# Patient Record
Sex: Female | Born: 1945 | Race: White | Hispanic: No | Marital: Married | Smoking: Former smoker
Health system: Southern US, Community
[De-identification: ages and names within clinical notes are randomized; demographics above are authoritative.]

## PROBLEM LIST (undated history)

## (undated) DIAGNOSIS — C801 Malignant (primary) neoplasm, unspecified: Secondary | ICD-10-CM

## (undated) DIAGNOSIS — K589 Irritable bowel syndrome without diarrhea: Secondary | ICD-10-CM

## (undated) DIAGNOSIS — I10 Essential (primary) hypertension: Secondary | ICD-10-CM

## (undated) DIAGNOSIS — Z9221 Personal history of antineoplastic chemotherapy: Secondary | ICD-10-CM

## (undated) DIAGNOSIS — M1990 Unspecified osteoarthritis, unspecified site: Secondary | ICD-10-CM

## (undated) DIAGNOSIS — E785 Hyperlipidemia, unspecified: Secondary | ICD-10-CM

## (undated) HISTORY — DX: Irritable bowel syndrome, unspecified: K58.9

## (undated) HISTORY — DX: Unspecified osteoarthritis, unspecified site: M19.90

## (undated) HISTORY — DX: Irritable bowel syndrome without diarrhea: K58.9

## (undated) HISTORY — DX: Hyperlipidemia, unspecified: E78.5

## (undated) HISTORY — PX: BREAST SURGERY: SHX581

---

## 1998-03-22 ENCOUNTER — Other Ambulatory Visit: Admission: RE | Admit: 1998-03-22 | Discharge: 1998-03-22 | Payer: Self-pay | Admitting: Obstetrics & Gynecology

## 1999-09-12 ENCOUNTER — Encounter: Payer: Self-pay | Admitting: Obstetrics and Gynecology

## 1999-09-12 ENCOUNTER — Encounter: Admission: RE | Admit: 1999-09-12 | Discharge: 1999-09-12 | Payer: Self-pay | Admitting: Obstetrics and Gynecology

## 1999-10-16 ENCOUNTER — Other Ambulatory Visit: Admission: RE | Admit: 1999-10-16 | Discharge: 1999-10-16 | Payer: Self-pay | Admitting: Obstetrics and Gynecology

## 2000-09-25 ENCOUNTER — Encounter: Payer: Self-pay | Admitting: Family Medicine

## 2000-09-25 ENCOUNTER — Encounter: Admission: RE | Admit: 2000-09-25 | Discharge: 2000-09-25 | Payer: Self-pay | Admitting: Family Medicine

## 2001-10-01 ENCOUNTER — Encounter: Admission: RE | Admit: 2001-10-01 | Discharge: 2001-10-01 | Payer: Self-pay | Admitting: Family Medicine

## 2001-10-01 ENCOUNTER — Encounter: Payer: Self-pay | Admitting: Family Medicine

## 2001-10-01 ENCOUNTER — Other Ambulatory Visit: Admission: RE | Admit: 2001-10-01 | Discharge: 2001-10-01 | Payer: Self-pay | Admitting: Obstetrics and Gynecology

## 2002-04-01 ENCOUNTER — Ambulatory Visit (HOSPITAL_COMMUNITY): Admission: RE | Admit: 2002-04-01 | Discharge: 2002-04-01 | Payer: Self-pay | Admitting: Gastroenterology

## 2002-09-03 ENCOUNTER — Emergency Department (HOSPITAL_COMMUNITY): Admission: EM | Admit: 2002-09-03 | Discharge: 2002-09-03 | Payer: Self-pay

## 2003-01-18 ENCOUNTER — Other Ambulatory Visit: Admission: RE | Admit: 2003-01-18 | Discharge: 2003-01-18 | Payer: Self-pay | Admitting: Obstetrics and Gynecology

## 2004-02-19 ENCOUNTER — Other Ambulatory Visit: Admission: RE | Admit: 2004-02-19 | Discharge: 2004-02-19 | Payer: Self-pay | Admitting: Obstetrics and Gynecology

## 2004-02-20 ENCOUNTER — Encounter: Admission: RE | Admit: 2004-02-20 | Discharge: 2004-02-20 | Payer: Self-pay | Admitting: Obstetrics and Gynecology

## 2005-02-20 ENCOUNTER — Encounter: Admission: RE | Admit: 2005-02-20 | Discharge: 2005-02-20 | Payer: Self-pay | Admitting: Obstetrics and Gynecology

## 2005-03-20 ENCOUNTER — Other Ambulatory Visit: Admission: RE | Admit: 2005-03-20 | Discharge: 2005-03-20 | Payer: Self-pay | Admitting: Obstetrics and Gynecology

## 2009-05-12 DIAGNOSIS — Z9221 Personal history of antineoplastic chemotherapy: Secondary | ICD-10-CM

## 2009-05-12 HISTORY — PX: BREAST BIOPSY: SHX20

## 2009-05-12 HISTORY — DX: Personal history of antineoplastic chemotherapy: Z92.21

## 2009-09-11 ENCOUNTER — Encounter: Admission: RE | Admit: 2009-09-11 | Discharge: 2009-09-11 | Payer: Self-pay | Admitting: Obstetrics and Gynecology

## 2009-09-14 ENCOUNTER — Encounter: Admission: RE | Admit: 2009-09-14 | Discharge: 2009-09-14 | Payer: Self-pay | Admitting: Obstetrics and Gynecology

## 2009-09-20 ENCOUNTER — Ambulatory Visit: Payer: Self-pay | Admitting: Oncology

## 2009-09-20 ENCOUNTER — Encounter: Admission: RE | Admit: 2009-09-20 | Discharge: 2009-09-20 | Payer: Self-pay | Admitting: Obstetrics and Gynecology

## 2009-09-21 ENCOUNTER — Encounter: Admission: RE | Admit: 2009-09-21 | Discharge: 2009-09-21 | Payer: Self-pay | Admitting: Obstetrics and Gynecology

## 2009-09-25 ENCOUNTER — Encounter: Admission: RE | Admit: 2009-09-25 | Discharge: 2009-09-25 | Payer: Self-pay | Admitting: Obstetrics and Gynecology

## 2009-09-28 ENCOUNTER — Ambulatory Visit: Admission: RE | Admit: 2009-09-28 | Discharge: 2009-10-01 | Payer: Self-pay | Admitting: Radiation Oncology

## 2009-10-04 LAB — CBC WITH DIFFERENTIAL/PLATELET
Basophils Absolute: 0.1 10*3/uL (ref 0.0–0.1)
EOS%: 1 % (ref 0.0–7.0)
Eosinophils Absolute: 0.1 10*3/uL (ref 0.0–0.5)
HGB: 14.7 g/dL (ref 11.6–15.9)
MONO#: 0.6 10*3/uL (ref 0.1–0.9)
NEUT#: 4.7 10*3/uL (ref 1.5–6.5)
RDW: 13.4 % (ref 11.2–14.5)
WBC: 8.2 10*3/uL (ref 3.9–10.3)
lymph#: 2.8 10*3/uL (ref 0.9–3.3)

## 2009-10-04 LAB — COMPREHENSIVE METABOLIC PANEL
AST: 24 U/L (ref 0–37)
Albumin: 4.2 g/dL (ref 3.5–5.2)
BUN: 9 mg/dL (ref 6–23)
Calcium: 8.9 mg/dL (ref 8.4–10.5)
Chloride: 104 mEq/L (ref 96–112)
Glucose, Bld: 95 mg/dL (ref 70–99)
Potassium: 3.6 mEq/L (ref 3.5–5.3)

## 2009-10-16 ENCOUNTER — Ambulatory Visit (HOSPITAL_COMMUNITY): Admission: RE | Admit: 2009-10-16 | Discharge: 2009-10-16 | Payer: Self-pay | Admitting: Oncology

## 2009-10-16 ENCOUNTER — Encounter: Payer: Self-pay | Admitting: Oncology

## 2009-10-17 ENCOUNTER — Ambulatory Visit (HOSPITAL_COMMUNITY): Admission: RE | Admit: 2009-10-17 | Discharge: 2009-10-17 | Payer: Self-pay | Admitting: Oncology

## 2009-10-22 ENCOUNTER — Telehealth (INDEPENDENT_AMBULATORY_CARE_PROVIDER_SITE_OTHER): Payer: Self-pay | Admitting: *Deleted

## 2009-10-23 ENCOUNTER — Encounter (HOSPITAL_COMMUNITY): Admission: RE | Admit: 2009-10-23 | Discharge: 2009-12-11 | Payer: Self-pay | Admitting: Oncology

## 2009-10-23 ENCOUNTER — Ambulatory Visit: Payer: Self-pay

## 2009-10-23 ENCOUNTER — Ambulatory Visit: Payer: Self-pay | Admitting: Cardiovascular Disease

## 2009-10-24 ENCOUNTER — Ambulatory Visit (HOSPITAL_BASED_OUTPATIENT_CLINIC_OR_DEPARTMENT_OTHER): Admission: RE | Admit: 2009-10-24 | Discharge: 2009-10-24 | Payer: Self-pay | Admitting: General Surgery

## 2009-10-26 ENCOUNTER — Ambulatory Visit: Payer: Self-pay | Admitting: Oncology

## 2009-10-30 LAB — CBC WITH DIFFERENTIAL/PLATELET
BASO%: 0.1 % (ref 0.0–2.0)
LYMPH%: 8.8 % — ABNORMAL LOW (ref 14.0–49.7)
MCHC: 33.9 g/dL (ref 31.5–36.0)
MONO#: 0.8 10*3/uL (ref 0.1–0.9)
MONO%: 5.8 % (ref 0.0–14.0)
Platelets: 327 10*3/uL (ref 145–400)
RBC: 4.83 10*6/uL (ref 3.70–5.45)
RDW: 13.7 % (ref 11.2–14.5)
WBC: 13.6 10*3/uL — ABNORMAL HIGH (ref 3.9–10.3)
nRBC: 0 % (ref 0–0)

## 2009-11-06 LAB — CBC WITH DIFFERENTIAL/PLATELET
BASO%: 0.8 % (ref 0.0–2.0)
EOS%: 0.2 % (ref 0.0–7.0)
MCH: 30.9 pg (ref 25.1–34.0)
MCHC: 34.4 g/dL (ref 31.5–36.0)
RDW: 13 % (ref 11.2–14.5)
WBC: 15.2 10*3/uL — ABNORMAL HIGH (ref 3.9–10.3)
lymph#: 2.5 10*3/uL (ref 0.9–3.3)

## 2009-11-13 LAB — CBC WITH DIFFERENTIAL/PLATELET
Basophils Absolute: 0.1 10*3/uL (ref 0.0–0.1)
EOS%: 0.1 % (ref 0.0–7.0)
Eosinophils Absolute: 0 10*3/uL (ref 0.0–0.5)
HGB: 13.1 g/dL (ref 11.6–15.9)
MCH: 30.9 pg (ref 25.1–34.0)
NEUT#: 3.7 10*3/uL (ref 1.5–6.5)
RDW: 13.4 % (ref 11.2–14.5)
lymph#: 1.8 10*3/uL (ref 0.9–3.3)

## 2009-11-20 LAB — CBC WITH DIFFERENTIAL/PLATELET
Basophils Absolute: 0 10*3/uL (ref 0.0–0.1)
Eosinophils Absolute: 0 10*3/uL (ref 0.0–0.5)
HGB: 13.6 g/dL (ref 11.6–15.9)
MCV: 91 fL (ref 79.5–101.0)
MONO%: 0.5 % (ref 0.0–14.0)
NEUT#: 10.7 10*3/uL — ABNORMAL HIGH (ref 1.5–6.5)
RDW: 14 % (ref 11.2–14.5)

## 2009-11-27 ENCOUNTER — Ambulatory Visit: Payer: Self-pay | Admitting: Oncology

## 2009-11-27 LAB — CBC WITH DIFFERENTIAL/PLATELET
Basophils Absolute: 0 10*3/uL (ref 0.0–0.1)
Eosinophils Absolute: 0.1 10*3/uL (ref 0.0–0.5)
HCT: 41.9 % (ref 34.8–46.6)
HGB: 14.4 g/dL (ref 11.6–15.9)
LYMPH%: 16.4 % (ref 14.0–49.7)
MCV: 89.1 fL (ref 79.5–101.0)
MONO%: 11.7 % (ref 0.0–14.0)
NEUT#: 8 10*3/uL — ABNORMAL HIGH (ref 1.5–6.5)
NEUT%: 71.2 % (ref 38.4–76.8)
Platelets: 146 10*3/uL (ref 145–400)
RBC: 4.7 10*6/uL (ref 3.70–5.45)

## 2009-11-28 LAB — URINALYSIS, MICROSCOPIC - CHCC
Nitrite: NEGATIVE
pH: 6 (ref 4.6–8.0)

## 2009-11-30 LAB — URINE CULTURE

## 2009-12-11 LAB — CBC WITH DIFFERENTIAL/PLATELET
Basophils Absolute: 0 10*3/uL (ref 0.0–0.1)
EOS%: 0 % (ref 0.0–7.0)
HGB: 12.7 g/dL (ref 11.6–15.9)
LYMPH%: 10.1 % — ABNORMAL LOW (ref 14.0–49.7)
MCH: 31.1 pg (ref 25.1–34.0)
MCV: 91.2 fL (ref 79.5–101.0)
MONO%: 5.3 % (ref 0.0–14.0)
NEUT%: 84.6 % — ABNORMAL HIGH (ref 38.4–76.8)
RDW: 15.8 % — ABNORMAL HIGH (ref 11.2–14.5)

## 2009-12-18 LAB — CBC WITH DIFFERENTIAL/PLATELET
BASO%: 0.6 % (ref 0.0–2.0)
LYMPH%: 15.6 % (ref 14.0–49.7)
MCH: 31.6 pg (ref 25.1–34.0)
MCHC: 34.3 g/dL (ref 31.5–36.0)
MCV: 92.2 fL (ref 79.5–101.0)
MONO%: 9.4 % (ref 0.0–14.0)
Platelets: 185 10*3/uL (ref 145–400)
RBC: 4.38 10*6/uL (ref 3.70–5.45)

## 2009-12-27 ENCOUNTER — Encounter: Admission: RE | Admit: 2009-12-27 | Discharge: 2009-12-27 | Payer: Self-pay | Admitting: Oncology

## 2009-12-28 ENCOUNTER — Ambulatory Visit: Payer: Self-pay | Admitting: Oncology

## 2009-12-28 LAB — COMPREHENSIVE METABOLIC PANEL
Albumin: 4.2 g/dL (ref 3.5–5.2)
BUN: 8 mg/dL (ref 6–23)
CO2: 26 mEq/L (ref 19–32)
Calcium: 8.9 mg/dL (ref 8.4–10.5)
Chloride: 104 mEq/L (ref 96–112)
Glucose, Bld: 85 mg/dL (ref 70–99)
Potassium: 3.2 mEq/L — ABNORMAL LOW (ref 3.5–5.3)

## 2009-12-28 LAB — URINALYSIS, MICROSCOPIC - CHCC
Bilirubin (Urine): NEGATIVE
Leukocyte Esterase: NEGATIVE
Nitrite: NEGATIVE
Protein: 30 mg/dL
pH: 6 (ref 4.6–8.0)

## 2009-12-28 LAB — CBC WITH DIFFERENTIAL/PLATELET
BASO%: 0.3 % (ref 0.0–2.0)
EOS%: 0 % (ref 0.0–7.0)
LYMPH%: 29.3 % (ref 14.0–49.7)
MCH: 31.5 pg (ref 25.1–34.0)
MCHC: 33.7 g/dL (ref 31.5–36.0)
MONO#: 0.5 10*3/uL (ref 0.1–0.9)
RBC: 3.9 10*6/uL (ref 3.70–5.45)
WBC: 3.9 10*3/uL (ref 3.9–10.3)
lymph#: 1.1 10*3/uL (ref 0.9–3.3)

## 2009-12-30 LAB — URINE CULTURE

## 2010-01-01 LAB — COMPREHENSIVE METABOLIC PANEL
AST: 25 U/L (ref 0–37)
Albumin: 4.5 g/dL (ref 3.5–5.2)
BUN: 18 mg/dL (ref 6–23)
Calcium: 9.5 mg/dL (ref 8.4–10.5)
Chloride: 104 mEq/L (ref 96–112)
Glucose, Bld: 144 mg/dL — ABNORMAL HIGH (ref 70–99)
Potassium: 3.8 mEq/L (ref 3.5–5.3)
Sodium: 137 mEq/L (ref 135–145)
Total Protein: 7.2 g/dL (ref 6.0–8.3)

## 2010-01-01 LAB — CBC WITH DIFFERENTIAL/PLATELET
Basophils Absolute: 0 10*3/uL (ref 0.0–0.1)
EOS%: 0 % (ref 0.0–7.0)
Eosinophils Absolute: 0 10*3/uL (ref 0.0–0.5)
HGB: 12.4 g/dL (ref 11.6–15.9)
NEUT#: 6.6 10*3/uL — ABNORMAL HIGH (ref 1.5–6.5)
RBC: 3.88 10*6/uL (ref 3.70–5.45)
RDW: 17.9 % — ABNORMAL HIGH (ref 11.2–14.5)
lymph#: 0.8 10*3/uL — ABNORMAL LOW (ref 0.9–3.3)

## 2010-01-04 LAB — URINALYSIS, MICROSCOPIC - CHCC
Bilirubin (Urine): NEGATIVE
Nitrite: NEGATIVE
Protein: NEGATIVE mg/dL
pH: 6 (ref 4.6–8.0)

## 2010-01-08 LAB — CBC WITH DIFFERENTIAL/PLATELET
BASO%: 2.6 % — ABNORMAL HIGH (ref 0.0–2.0)
EOS%: 0.1 % (ref 0.0–7.0)
MCH: 32.4 pg (ref 25.1–34.0)
MCHC: 33.7 g/dL (ref 31.5–36.0)
RBC: 3.85 10*6/uL (ref 3.70–5.45)
RDW: 19.4 % — ABNORMAL HIGH (ref 11.2–14.5)
lymph#: 1.7 10*3/uL (ref 0.9–3.3)

## 2010-01-22 LAB — CBC WITH DIFFERENTIAL/PLATELET
Basophils Absolute: 0 10*3/uL (ref 0.0–0.1)
EOS%: 0.1 % (ref 0.0–7.0)
HCT: 33.8 % — ABNORMAL LOW (ref 34.8–46.6)
HGB: 11.4 g/dL — ABNORMAL LOW (ref 11.6–15.9)
LYMPH%: 5.7 % — ABNORMAL LOW (ref 14.0–49.7)
MCH: 33.5 pg (ref 25.1–34.0)
MCV: 99.8 fL (ref 79.5–101.0)
MONO%: 2.2 % (ref 0.0–14.0)
NEUT%: 91.8 % — ABNORMAL HIGH (ref 38.4–76.8)

## 2010-01-22 LAB — COMPREHENSIVE METABOLIC PANEL
AST: 28 U/L (ref 0–37)
Alkaline Phosphatase: 63 U/L (ref 39–117)
BUN: 17 mg/dL (ref 6–23)
Calcium: 9.1 mg/dL (ref 8.4–10.5)
Creatinine, Ser: 0.93 mg/dL (ref 0.40–1.20)
Total Bilirubin: 0.8 mg/dL (ref 0.3–1.2)

## 2010-01-26 ENCOUNTER — Emergency Department (HOSPITAL_COMMUNITY): Admission: EM | Admit: 2010-01-26 | Discharge: 2010-01-26 | Payer: Self-pay | Admitting: Emergency Medicine

## 2010-01-29 ENCOUNTER — Ambulatory Visit: Payer: Self-pay | Admitting: Oncology

## 2010-01-29 ENCOUNTER — Encounter: Payer: Self-pay | Admitting: Cardiovascular Disease

## 2010-01-29 LAB — CBC WITH DIFFERENTIAL/PLATELET
Basophils Absolute: 0.2 10*3/uL — ABNORMAL HIGH (ref 0.0–0.1)
EOS%: 0.1 % (ref 0.0–7.0)
HCT: 35.4 % (ref 34.8–46.6)
HGB: 12.2 g/dL (ref 11.6–15.9)
MCH: 34.1 pg — ABNORMAL HIGH (ref 25.1–34.0)
MCV: 99.3 fL (ref 79.5–101.0)
NEUT%: 76.5 % (ref 38.4–76.8)
lymph#: 1.7 10*3/uL (ref 0.9–3.3)

## 2010-02-02 ENCOUNTER — Emergency Department (HOSPITAL_COMMUNITY): Admission: EM | Admit: 2010-02-02 | Discharge: 2010-02-02 | Payer: Self-pay | Admitting: Emergency Medicine

## 2010-02-06 ENCOUNTER — Telehealth (INDEPENDENT_AMBULATORY_CARE_PROVIDER_SITE_OTHER): Payer: Self-pay | Admitting: *Deleted

## 2010-02-07 ENCOUNTER — Encounter (HOSPITAL_COMMUNITY): Admission: RE | Admit: 2010-02-07 | Discharge: 2010-02-15 | Payer: Self-pay | Admitting: Oncology

## 2010-02-07 ENCOUNTER — Encounter: Payer: Self-pay | Admitting: Internal Medicine

## 2010-02-07 ENCOUNTER — Ambulatory Visit: Payer: Self-pay

## 2010-02-07 ENCOUNTER — Ambulatory Visit: Payer: Self-pay | Admitting: Cardiology

## 2010-02-09 ENCOUNTER — Ambulatory Visit: Payer: Self-pay | Admitting: Oncology

## 2010-02-12 LAB — URINALYSIS, MICROSCOPIC - CHCC
Bilirubin (Urine): NEGATIVE
Ketones: NEGATIVE mg/dL
Specific Gravity, Urine: 1.025 (ref 1.003–1.035)

## 2010-02-12 LAB — CBC WITH DIFFERENTIAL/PLATELET
Basophils Absolute: 0 10*3/uL (ref 0.0–0.1)
EOS%: 0 % (ref 0.0–7.0)
Eosinophils Absolute: 0 10*3/uL (ref 0.0–0.5)
HCT: 31.6 % — ABNORMAL LOW (ref 34.8–46.6)
HGB: 10.8 g/dL — ABNORMAL LOW (ref 11.6–15.9)
MCH: 35.1 pg — ABNORMAL HIGH (ref 25.1–34.0)
MCV: 102.6 fL — ABNORMAL HIGH (ref 79.5–101.0)
MONO%: 5.1 % (ref 0.0–14.0)
NEUT#: 6.2 10*3/uL (ref 1.5–6.5)
NEUT%: 84.6 % — ABNORMAL HIGH (ref 38.4–76.8)
RDW: 17.7 % — ABNORMAL HIGH (ref 11.2–14.5)
lymph#: 0.8 10*3/uL — ABNORMAL LOW (ref 0.9–3.3)

## 2010-02-12 LAB — COMPREHENSIVE METABOLIC PANEL
AST: 34 U/L (ref 0–37)
Albumin: 4.1 g/dL (ref 3.5–5.2)
BUN: 17 mg/dL (ref 6–23)
Calcium: 9.1 mg/dL (ref 8.4–10.5)
Chloride: 99 mEq/L (ref 96–112)
Creatinine, Ser: 0.98 mg/dL (ref 0.40–1.20)
Glucose, Bld: 165 mg/dL — ABNORMAL HIGH (ref 70–99)
Potassium: 3.1 mEq/L — ABNORMAL LOW (ref 3.5–5.3)

## 2010-02-14 LAB — URINE CULTURE

## 2010-02-17 ENCOUNTER — Encounter: Admission: RE | Admit: 2010-02-17 | Discharge: 2010-02-17 | Payer: Self-pay | Admitting: Oncology

## 2010-02-25 LAB — CBC WITH DIFFERENTIAL/PLATELET
BASO%: 0.3 % (ref 0.0–2.0)
Basophils Absolute: 0.1 10*3/uL (ref 0.0–0.1)
EOS%: 0.2 % (ref 0.0–7.0)
MCH: 34.7 pg — ABNORMAL HIGH (ref 25.1–34.0)
MCHC: 33.2 g/dL (ref 31.5–36.0)
MCV: 104.5 fL — ABNORMAL HIGH (ref 79.5–101.0)
MONO%: 8 % (ref 0.0–14.0)
RBC: 3.54 10*6/uL — ABNORMAL LOW (ref 3.70–5.45)
RDW: 14.9 % — ABNORMAL HIGH (ref 11.2–14.5)
lymph#: 2.2 10*3/uL (ref 0.9–3.3)
nRBC: 0 % (ref 0–0)

## 2010-03-01 ENCOUNTER — Telehealth (INDEPENDENT_AMBULATORY_CARE_PROVIDER_SITE_OTHER): Payer: Self-pay | Admitting: *Deleted

## 2010-03-05 ENCOUNTER — Encounter (INDEPENDENT_AMBULATORY_CARE_PROVIDER_SITE_OTHER): Payer: Self-pay | Admitting: General Surgery

## 2010-03-05 ENCOUNTER — Ambulatory Visit (HOSPITAL_COMMUNITY): Admission: RE | Admit: 2010-03-05 | Discharge: 2010-03-06 | Payer: Self-pay | Admitting: General Surgery

## 2010-03-05 HISTORY — PX: MASTECTOMY: SHX3

## 2010-03-21 ENCOUNTER — Ambulatory Visit: Payer: Self-pay | Admitting: Oncology

## 2010-03-25 ENCOUNTER — Telehealth (INDEPENDENT_AMBULATORY_CARE_PROVIDER_SITE_OTHER): Payer: Self-pay | Admitting: *Deleted

## 2010-03-25 LAB — COMPREHENSIVE METABOLIC PANEL
Alkaline Phosphatase: 61 U/L (ref 39–117)
CO2: 29 mEq/L (ref 19–32)
Creatinine, Ser: 0.75 mg/dL (ref 0.40–1.20)
Glucose, Bld: 98 mg/dL (ref 70–99)
Total Bilirubin: 0.5 mg/dL (ref 0.3–1.2)

## 2010-03-25 LAB — CBC WITH DIFFERENTIAL/PLATELET
BASO%: 0.7 % (ref 0.0–2.0)
Basophils Absolute: 0.1 10*3/uL (ref 0.0–0.1)
EOS%: 0.4 % (ref 0.0–7.0)
HGB: 12.5 g/dL (ref 11.6–15.9)
MCH: 34.7 pg — ABNORMAL HIGH (ref 25.1–34.0)
RDW: 14.4 % (ref 11.2–14.5)
lymph#: 1.7 10*3/uL (ref 0.9–3.3)

## 2010-04-16 ENCOUNTER — Ambulatory Visit
Admission: RE | Admit: 2010-04-16 | Discharge: 2010-06-11 | Payer: Self-pay | Source: Home / Self Care | Attending: Radiation Oncology | Admitting: Radiation Oncology

## 2010-06-02 ENCOUNTER — Encounter: Payer: Self-pay | Admitting: Obstetrics and Gynecology

## 2010-06-11 NOTE — Progress Notes (Signed)
Summary: Nuclear MUGA Pre-Procedure  Phone Note Outgoing Call Call back at [facility] Phone [phone]   Call placed by: Stanton Kidney, EMT-P,  October 22, 2009 3:31 PM Summary of Call: Unable to leave message with information on Myoview Information Sheet (see scanned document for details). No answer/no machine.     Nuclear Med Background Indications for Stress Test: Evaluation for Ischemia  Indications Comments: Prior Chemotherapy Evaluation  History: Echo  History Comments: 10/16/09 Echo: EF= 35-40%     Nuclear Pre-Procedure Cardiac Risk Factors: Hypertension, Lipids

## 2010-06-11 NOTE — Letter (Signed)
Summary: [facility] Physician Order [facility] Physician Order Form   Imported By: Roderic Ovens 02/13/2010 12:17:08  _____________________________________________________________________  External Attachment:    Type:   Image     Comment:   External Document

## 2010-06-11 NOTE — Progress Notes (Signed)
Summary: Nuclear pre procedure  Phone Note Outgoing Call Call back at [facility] Phone [phone]   Call placed by: Rea College, CMA,  February 06, 2010 5:17 PM Call placed to: Patient Summary of Call: Reviewed information on Muga Information Sheet (see scanned document for further details).  Spoke with patient.

## 2010-06-11 NOTE — Progress Notes (Signed)
  Phone Note From Other Clinic   Caller: Tonya/[facility] Initial call taken by: Km    Faxed Muga over to [phone] [facility]  March 01, 2010 12:05 PM

## 2010-06-11 NOTE — Progress Notes (Signed)
Summary: Records Request  Faxed MUGA to [place] at [phone]. Debby Freiberg  March 01, 2010 10:23 AM

## 2010-06-11 NOTE — Progress Notes (Signed)
Summary: Records Request  Faxed MUGA to [place] at [facility] ([phone]). Debby Freiberg  March 25, 2010 1:58 PM

## 2010-06-11 NOTE — Assessment & Plan Note (Signed)
Summary: Cardiology Nuclear Testing    Muga Study  Indication: Evaluate LVEF on patient receiving chemotherpy for breast cancer. Previous MUGA showed an EF of 35-40%  Muga Information: The patient's red blood cells were labeled using the Ultra Tag method with 33  mci of Technitium-67m Pertechnetate.  The images were reconstructed in the Anterior, Lateral and Left Anterior Oblique Views.  Impression: The patient's gated ejection fraction was 59.9 % and the wall motion was normal.    Nuclear Med Background Indications for Stress Test: Evaluation for Ischemia  Indications Comments: Prior Chemotherapy Evaluation  History: Echo  History Comments: 10/16/09 Echo: EF= 35-40%     Nuclear Pre-Procedure Cardiac Risk Factors: Hypertension, Lipids

## 2010-06-11 NOTE — Assessment & Plan Note (Signed)
Summary: MUGA Study    Muga Study  Indication: Evaluation for ischemia Evaluation prior to chemotherapy, d/t recent Echo EF= 35-40% 20g angiocath established (L) AC by: Stanton Kidney, EMT-P  October 23, 2009 2:26 PM   Muga Information: The patient's red blood cells were labeled using the Ultra Tag method with 32.0 mci of Technitium-13m Pertechnetate.  The images were reconstructed in the Anterior, Lateral and Left Anterior Oblique Views.  Impression: Images were reviewed in the best lateral, AP and LAO projections.  The lateral images were suboptimal but overall LV cavity size and funtion were normal. The calculated EF was 63%

## 2010-06-12 ENCOUNTER — Ambulatory Visit: Payer: Self-pay | Admitting: Radiation Oncology

## 2010-06-12 ENCOUNTER — Ambulatory Visit (HOSPITAL_BASED_OUTPATIENT_CLINIC_OR_DEPARTMENT_OTHER)
Admission: RE | Admit: 2010-06-12 | Discharge: 2010-06-12 | Disposition: A | Discharge status: Home / Self Care | Payer: MEDICARE | Attending: General Surgery | Admitting: General Surgery

## 2010-06-12 DIAGNOSIS — Z452 Encounter for adjustment and management of vascular access device: Secondary | ICD-10-CM | POA: Insufficient documentation

## 2010-06-12 DIAGNOSIS — C50919 Malignant neoplasm of unspecified site of unspecified female breast: Secondary | ICD-10-CM | POA: Insufficient documentation

## 2010-06-14 NOTE — Op Note (Signed)
  NAMEJOANI, Savannah Jones           ACCOUNT NO.:  [id number]  MEDICAL RECORD NO.:  [id number]          PATIENT TYPE:  AMB  LOCATION:  DSC                          FACILITY:  [facility]  PHYSICIAN:  Ollen Gross. Vernell Morgans, M.D. DATE OF BIRTH:  04/05/1946  DATE OF PROCEDURE:  06/12/2010 DATE OF DISCHARGE:                              OPERATIVE REPORT   PREOPERATIVE DIAGNOSIS:  History of right breast cancer.  POSTOPERATIVE DIAGNOSIS:  History of right breast cancer.  PROCEDURE:  Removal of left subclavian vein Port-A-Cath.  SURGEON:  Ollen Gross. Vernell Morgans, MD  ANESTHESIA:  Local.  PROCEDURE:  After informed consent was obtained, the patient was brought to the operating room, placed in supine position on the operating table. The patient's left chest area was prepped with DuraPrep, allowed to dry and then draped in usual sterile manner.  The area around the port was infiltrated with 1% lidocaine with epinephrine.  A small incision was made with 15 blade knife through the old incision.  This incision was carried down through the subcutaneous tissue sharply with Metzenbaum scissors until the port was identified.  The capsule the port was opened with a 15 blade knife.  The two anchoring stitches were identified and were grasped with a hemostat, divided, and removed.  The port was then able to be easily pushed out of its capsule.  With gentle traction, the port was removed without difficulty.  Pressure was held for several minutes until the area was completely hemostatic.  The deep layer of the wound was then closed with interrupted 3-0 Vicryl stitches, and the skin was closed with a running for Monocryl subcuticular stitch.  Dermabond dressing was applied.  The patient tolerated the procedure well.  At the end the of case, all needle, sponge, and instrument counts were correct. The patient was then taken to recovery room in stable condition.     Ollen Gross. Vernell Morgans, M.D.     PST/MEDQ  D:   06/12/2010  T:  06/12/2010  Job:  [id number]  Electronically Signed by Chevis Pretty III M.D. on 06/14/2010 08:55:18 AM

## 2010-06-25 ENCOUNTER — Other Ambulatory Visit: Payer: Self-pay | Admitting: Oncology

## 2010-06-25 ENCOUNTER — Encounter (HOSPITAL_BASED_OUTPATIENT_CLINIC_OR_DEPARTMENT_OTHER): Payer: MEDICARE | Admitting: Oncology

## 2010-06-25 DIAGNOSIS — C50419 Malignant neoplasm of upper-outer quadrant of unspecified female breast: Secondary | ICD-10-CM

## 2010-06-25 LAB — COMPREHENSIVE METABOLIC PANEL
ALT: 18 U/L (ref 0–35)
Albumin: 4.4 g/dL (ref 3.5–5.2)
Alkaline Phosphatase: 89 U/L (ref 39–117)
CO2: 27 mEq/L (ref 19–32)
Potassium: 3.5 mEq/L (ref 3.5–5.3)
Sodium: 140 mEq/L (ref 135–145)
Total Bilirubin: 0.3 mg/dL (ref 0.3–1.2)
Total Protein: 7.1 g/dL (ref 6.0–8.3)

## 2010-06-25 LAB — CBC WITH DIFFERENTIAL/PLATELET
BASO%: 0.5 % (ref 0.0–2.0)
LYMPH%: 28.6 % (ref 14.0–49.7)
MCHC: 32.6 g/dL (ref 31.5–36.0)
MONO#: 0.6 10*3/uL (ref 0.1–0.9)
MONO%: 7.2 % (ref 0.0–14.0)
NEUT#: 5.2 10*3/uL (ref 1.5–6.5)
Platelets: 301 10*3/uL (ref 145–400)
RBC: 4.57 10*6/uL (ref 3.70–5.45)
RDW: 13.3 % (ref 11.2–14.5)
WBC: 8.2 10*3/uL (ref 3.9–10.3)

## 2010-07-02 ENCOUNTER — Encounter (HOSPITAL_BASED_OUTPATIENT_CLINIC_OR_DEPARTMENT_OTHER): Payer: MEDICARE | Admitting: Oncology

## 2010-07-02 ENCOUNTER — Other Ambulatory Visit: Payer: Self-pay | Admitting: Oncology

## 2010-07-02 DIAGNOSIS — C50419 Malignant neoplasm of upper-outer quadrant of unspecified female breast: Secondary | ICD-10-CM

## 2010-07-02 DIAGNOSIS — Z1231 Encounter for screening mammogram for malignant neoplasm of breast: Secondary | ICD-10-CM

## 2010-07-24 LAB — CBC
HCT: 38.4 % (ref 36.0–46.0)
Hemoglobin: 12.7 g/dL (ref 12.0–15.0)
MCH: 34.5 pg — ABNORMAL HIGH (ref 26.0–34.0)
MCV: 104.3 fL — ABNORMAL HIGH (ref 78.0–100.0)
Platelets: 298 10*3/uL (ref 150–400)
RBC: 3.68 MIL/uL — ABNORMAL LOW (ref 3.87–5.11)
WBC: 10.9 10*3/uL — ABNORMAL HIGH (ref 4.0–10.5)

## 2010-07-24 LAB — COMPREHENSIVE METABOLIC PANEL
Albumin: 4.3 g/dL (ref 3.5–5.2)
Alkaline Phosphatase: 73 U/L (ref 39–117)
BUN: 9 mg/dL (ref 6–23)
CO2: 26 mEq/L (ref 19–32)
Chloride: 99 mEq/L (ref 96–112)
GFR calc non Af Amer: 59 mL/min — ABNORMAL LOW (ref >60)
Glucose, Bld: 100 mg/dL — ABNORMAL HIGH (ref 70–99)
Potassium: 3.9 mEq/L (ref 3.5–5.1)
Total Bilirubin: 0.6 mg/dL (ref 0.3–1.2)

## 2010-07-24 LAB — DIFFERENTIAL
Basophils Absolute: 0.1 10*3/uL (ref 0.0–0.1)
Basophils Relative: 1 % (ref 0–1)
Lymphocytes Relative: 22 % (ref 12–46)
Monocytes Relative: 9 % (ref 3–12)
Neutro Abs: 7.4 10*3/uL (ref 1.7–7.7)
Neutrophils Relative %: 68 % (ref 43–77)

## 2010-07-24 LAB — SURGICAL PCR SCREEN: MRSA, PCR: NEGATIVE

## 2010-07-25 LAB — URINALYSIS, ROUTINE W REFLEX MICROSCOPIC
Hgb urine dipstick: NEGATIVE
Ketones, ur: 15 mg/dL — AB
Nitrite: NEGATIVE
Nitrite: NEGATIVE
Protein, ur: 30 mg/dL — AB
Specific Gravity, Urine: 1.028 (ref 1.005–1.030)
Urobilinogen, UA: 1 mg/dL (ref 0.0–1.0)
pH: 6 (ref 5.0–8.0)

## 2010-07-25 LAB — DIFFERENTIAL
Basophils Absolute: 0 10*3/uL (ref 0.0–0.1)
Basophils Relative: 0 % (ref 0–1)
Lymphocytes Relative: 12 % (ref 12–46)
Lymphs Abs: 1.7 10*3/uL (ref 0.7–4.0)
Monocytes Relative: 0 % — ABNORMAL LOW (ref 3–12)
Monocytes Relative: 8 % (ref 3–12)
Neutro Abs: 11.4 10*3/uL — ABNORMAL HIGH (ref 1.7–7.7)
Neutro Abs: 6.9 10*3/uL (ref 1.7–7.7)
Neutrophils Relative %: 73 % (ref 43–77)

## 2010-07-25 LAB — URINE CULTURE
Colony Count: 30000
Colony Count: NO GROWTH
Culture: NO GROWTH

## 2010-07-25 LAB — COMPREHENSIVE METABOLIC PANEL
Albumin: 4.5 g/dL (ref 3.5–5.2)
BUN: 11 mg/dL (ref 6–23)
Chloride: 91 mEq/L — ABNORMAL LOW (ref 96–112)
Creatinine, Ser: 0.85 mg/dL (ref 0.4–1.2)
Total Bilirubin: 1.5 mg/dL — ABNORMAL HIGH (ref 0.3–1.2)
Total Protein: 7.1 g/dL (ref 6.0–8.3)

## 2010-07-25 LAB — LACTIC ACID, PLASMA
Lactic Acid, Venous: 2.2 mmol/L (ref 0.5–2.2)
Lactic Acid, Venous: 2.5 mmol/L — ABNORMAL HIGH (ref 0.5–2.2)
Lactic Acid, Venous: 2.7 mmol/L — ABNORMAL HIGH (ref 0.5–2.2)

## 2010-07-25 LAB — CULTURE, BLOOD (ROUTINE X 2)
Culture  Setup Time: 201109172038
Culture: NO GROWTH

## 2010-07-25 LAB — URINE MICROSCOPIC-ADD ON

## 2010-07-25 LAB — BASIC METABOLIC PANEL
Calcium: 9.1 mg/dL (ref 8.4–10.5)
Creatinine, Ser: 0.89 mg/dL (ref 0.4–1.2)
GFR calc Af Amer: >60 mL/min (ref >60)
GFR calc non Af Amer: >60 mL/min (ref >60)
Sodium: 137 mEq/L (ref 135–145)

## 2010-07-25 LAB — CBC
Hemoglobin: 11.8 g/dL — ABNORMAL LOW (ref 12.0–15.0)
MCH: 34.4 pg — ABNORMAL HIGH (ref 26.0–34.0)
MCHC: 34.1 g/dL (ref 30.0–36.0)
MCHC: 34.4 g/dL (ref 30.0–36.0)
MCV: 100.9 fL — ABNORMAL HIGH (ref 78.0–100.0)
Platelets: 134 10*3/uL — ABNORMAL LOW (ref 150–400)
Platelets: 193 10*3/uL (ref 150–400)
RBC: 3.33 MIL/uL — ABNORMAL LOW (ref 3.87–5.11)
RDW: 20.3 % — ABNORMAL HIGH (ref 11.5–15.5)

## 2010-07-29 LAB — BASIC METABOLIC PANEL
BUN: 10 mg/dL (ref 6–23)
Chloride: 106 mEq/L (ref 96–112)
Creatinine, Ser: 0.77 mg/dL (ref 0.4–1.2)
GFR calc Af Amer: >60 mL/min (ref >60)
GFR calc non Af Amer: >60 mL/min (ref >60)

## 2010-07-29 LAB — GLUCOSE, CAPILLARY: Glucose-Capillary: 103 mg/dL — ABNORMAL HIGH (ref 70–99)

## 2010-09-13 ENCOUNTER — Ambulatory Visit: Payer: MEDICARE

## 2010-09-18 ENCOUNTER — Ambulatory Visit: Payer: MEDICARE

## 2010-09-18 ENCOUNTER — Ambulatory Visit
Admission: RE | Admit: 2010-09-18 | Discharge: 2010-09-18 | Disposition: A | Discharge status: Home / Self Care | Payer: MEDICARE | Source: Ambulatory Visit | Attending: Oncology | Admitting: Oncology

## 2010-09-18 DIAGNOSIS — Z1231 Encounter for screening mammogram for malignant neoplasm of breast: Secondary | ICD-10-CM

## 2010-09-27 NOTE — Op Note (Signed)
   [name], Savannah Jones                     ACCOUNT NO.:  [id number]   MEDICAL RECORD NO.:  [id number]                   PATIENT TYPE:  AMB   LOCATION:  ENDO                                 FACILITY:  [facility]   PHYSICIAN:  Bernette Redbird, M.D.                DATE OF BIRTH:  Apr 24, 1946   DATE OF PROCEDURE:  04/01/2002  DATE OF DISCHARGE:                                 OPERATIVE REPORT   PROCEDURE:  Colonoscopy.   INDICATIONS:  Screening for colon cancer in a 65 year old female with a  history of IBS.   FINDINGS:  Normal exam to the terminal ileum.   DESCRIPTION OF PROCEDURE:  The nature, purpose, and risks of the procedure  had been discussed with the patient, who provided written consent.  Sedation  was fentanyl 100 mcg and Versed 8 mg IV without arrhythmias or desaturation.  The Olympus adult video colonoscope was advanced to the terminal ileum,  using just a little bit of external abdominal compression to control  looping.  Pullback was then performed.  The quality of prep was excellent,  and it was felt that all areas were well-seen.   This was a normal examination.  No polyps, cancer, colitis, vascular  malformations, or diverticular disease were observed.  Retroflexion in the  rectum showed some internal hemorrhoids.  Reinspection of the rectum was  unremarkable.  No biopsies were obtained.  The patient tolerated the  procedure well, and there were no apparent complications.   IMPRESSION:  Normal colonoscopy.   PLAN:  Repeat colonoscopy or flexible sigmoidoscopy in five years for  continued colon cancer screening.                                               Bernette Redbird, M.D.    RB/MEDQ  D:  04/01/2002  T:  04/01/2002  Job:  [id number]   cc:   Donia Guiles, M.D.  [address] [place]  [place] [address]  Fax: [phone]

## 2010-12-23 ENCOUNTER — Other Ambulatory Visit: Payer: Self-pay | Admitting: Obstetrics and Gynecology

## 2011-01-14 ENCOUNTER — Telehealth (INDEPENDENT_AMBULATORY_CARE_PROVIDER_SITE_OTHER): Payer: Self-pay

## 2011-01-14 NOTE — Telephone Encounter (Signed)
Pt called requesting an appt to see Dr Carolynne Edouard this wk b/c her axilla has been swelling on and off of the masectomy side. The pt said its just the axilla not the arm and she is able to move the arm fine. I offered an appt with Dr Carolynne Edouard for Thursday and she will be here./ [facility]

## 2011-01-16 ENCOUNTER — Ambulatory Visit (INDEPENDENT_AMBULATORY_CARE_PROVIDER_SITE_OTHER): Payer: Medicare Other | Admitting: General Surgery

## 2011-01-16 ENCOUNTER — Encounter (INDEPENDENT_AMBULATORY_CARE_PROVIDER_SITE_OTHER): Payer: Self-pay | Admitting: General Surgery

## 2011-01-16 VITALS — BP 130/80 | HR 72 | Temp 98.2°F | Resp 12

## 2011-01-16 DIAGNOSIS — C50919 Malignant neoplasm of unspecified site of unspecified female breast: Secondary | ICD-10-CM

## 2011-01-16 NOTE — Progress Notes (Signed)
Subjective:     Patient ID: Kayleena Eke, female   DOB: May 01, 1946, 65 y.o.   MRN: [id number]  HPI The patient is a 65 year old white female who is now almost a year out from a right mastectomy for a T1 C. N0 right breast cancer after neoadjuvant therapy. She was ER positive PR negative and HER-2 negative. Recently she felt what she thought was some swelling of the right chest wall near the axilla. She noticed this for a couple days and then it seemed to go away. She denies any fevers or chills. She is not really having any pain.  Review of Systems     Objective:   Physical Exam On exam Lungs: Clear bilaterally with no use of accessory respiratory muscles Heart: Regular rate and rhythm with an impulse in the left chest Abdomen: Soft and nontender with no palpable mass or hepatosplenomegaly Breasts: Her right mastectomy incision has healed nicely. She has no palpable mass of the right chest wall. There does not appear to be any swelling or fluid along the right chest wall. No axillary supraclavicular or cervical lymphadenopathy    Assessment:     Almost one year out from a right mastectomy    Plan:     I cannot appreciate any swelling today of her chest wall or right arm. She will continue to do regular self checks. She will also continue Letrozole. We will plan to see her back in about 3 months.

## 2011-01-16 NOTE — Patient Instructions (Signed)
Continue regular self exams Mention to Dr. Darnelle Catalan that your joints hurt

## 2011-02-18 ENCOUNTER — Other Ambulatory Visit: Payer: Self-pay | Admitting: Oncology

## 2011-02-18 ENCOUNTER — Encounter (HOSPITAL_BASED_OUTPATIENT_CLINIC_OR_DEPARTMENT_OTHER): Payer: Self-pay | Admitting: Oncology

## 2011-02-18 DIAGNOSIS — C50419 Malignant neoplasm of upper-outer quadrant of unspecified female breast: Secondary | ICD-10-CM

## 2011-02-18 LAB — CBC WITH DIFFERENTIAL/PLATELET
Basophils Absolute: 0.1 10*3/uL (ref 0.0–0.1)
EOS%: 2 % (ref 0.0–7.0)
HGB: 13.8 g/dL (ref 11.6–15.9)
MCH: 31.1 pg (ref 25.1–34.0)
MCV: 92.3 fL (ref 79.5–101.0)
MONO%: 7.3 % (ref 0.0–14.0)
NEUT%: 54.9 % (ref 38.4–76.8)
RDW: 13.3 % (ref 11.2–14.5)

## 2011-02-18 LAB — COMPREHENSIVE METABOLIC PANEL
AST: 17 U/L (ref 0–37)
Alkaline Phosphatase: 76 U/L (ref 39–117)
BUN: 13 mg/dL (ref 6–23)
Creatinine, Ser: 0.86 mg/dL (ref 0.50–1.10)
Potassium: 3.6 mEq/L (ref 3.5–5.3)

## 2011-02-25 ENCOUNTER — Encounter (HOSPITAL_BASED_OUTPATIENT_CLINIC_OR_DEPARTMENT_OTHER): Payer: Self-pay | Admitting: Oncology

## 2011-02-25 DIAGNOSIS — C50419 Malignant neoplasm of upper-outer quadrant of unspecified female breast: Secondary | ICD-10-CM

## 2011-04-17 ENCOUNTER — Ambulatory Visit (INDEPENDENT_AMBULATORY_CARE_PROVIDER_SITE_OTHER): Payer: Medicare Other | Admitting: General Surgery

## 2011-04-17 ENCOUNTER — Encounter (INDEPENDENT_AMBULATORY_CARE_PROVIDER_SITE_OTHER): Payer: Self-pay | Admitting: General Surgery

## 2011-04-21 ENCOUNTER — Ambulatory Visit (INDEPENDENT_AMBULATORY_CARE_PROVIDER_SITE_OTHER): Payer: Medicare Other | Admitting: General Surgery

## 2011-04-21 VITALS — BP 140/80 | HR 72 | Temp 98.2°F | Resp 12 | Ht 64.0 in | Wt 209.0 lb

## 2011-04-21 DIAGNOSIS — C50919 Malignant neoplasm of unspecified site of unspecified female breast: Secondary | ICD-10-CM

## 2011-04-21 NOTE — Patient Instructions (Signed)
Continue regular self exams  

## 2011-04-21 NOTE — Progress Notes (Signed)
Subjective:     Patient ID: Savannah Jones, female   DOB: 1945/09/11, 65 y.o.   MRN: [id number]  HPI The patient is a 65 year old white female who is just over a year out from a right mastectomy and negative sentinel node biopsy for a T1 C. N0 right breast cancer. She's been doing well. Since her last visit her chest wall soreness is improved. She is no complaints today. Her next mammogram is due in May.  Review of Systems  Constitutional: Negative.   HENT: Negative.   Eyes: Negative.   Respiratory: Negative.   Cardiovascular: Negative.   Gastrointestinal: Negative.   Musculoskeletal: Negative.   Skin: Negative.   Neurological: Negative.   Hematological: Negative.   Psychiatric/Behavioral: Negative.        Objective:   Physical Exam  Constitutional: She is oriented to person, place, and time. She appears well-developed and well-nourished.  HENT:  Head: Normocephalic and atraumatic.  Eyes: Conjunctivae and EOM are normal. Pupils are equal, round, and reactive to light.  Neck: Normal range of motion. Neck supple.  Cardiovascular: Normal rate, regular rhythm and normal heart sounds.   Pulmonary/Chest: Effort normal and breath sounds normal.       She has no palpable mass of the right chest wall. No palpable mass of the left breast. No axillary or supraclavicular cervical lymphadenopathy   Abdominal: Soft. Bowel sounds are normal. She exhibits no mass. There is no tenderness.  Musculoskeletal: Normal range of motion.  Lymphadenopathy:    She has no cervical adenopathy.  Neurological: She is alert and oriented to person, place, and time.  Skin: Skin is warm and dry.  Psychiatric: She has a normal mood and affect. Her behavior is normal.       Assessment:     She is just over a year out from a right mastectomy and negative sentinel node biopsy    Plan:     She is currently off the Femara because of severe joint aches. She will decide in the next few weeks with her  oncologist whether she will go back on this or not. Otherwise I have encouraged her to continue regular self exams. We will plan to see her back in about 3 months.

## 2011-05-12 ENCOUNTER — Telehealth: Payer: Self-pay | Admitting: *Deleted

## 2011-05-12 NOTE — Telephone Encounter (Signed)
Pt called stating " Dr Darnelle Catalan had me stop the letrozole at the end of October and requested for me to call at the end of December and let him know if the symptoms resolved "  Pt states she has noted some improvement from severe arthritic discomfort interfering with ADL's.  Savannah Jones stated history with some arthritis but letrozole seemed to cause daily interference.  Since off letrozole she has occasional arthritis flare ups but not incapacitating.  Overall pt is better off the letrozole.  This note will be given to MD.

## 2011-07-04 ENCOUNTER — Other Ambulatory Visit: Payer: Self-pay | Admitting: Dermatology

## 2011-07-24 ENCOUNTER — Encounter (INDEPENDENT_AMBULATORY_CARE_PROVIDER_SITE_OTHER): Payer: Self-pay | Admitting: General Surgery

## 2011-07-28 ENCOUNTER — Ambulatory Visit (INDEPENDENT_AMBULATORY_CARE_PROVIDER_SITE_OTHER): Payer: Medicare Other | Admitting: General Surgery

## 2011-08-11 ENCOUNTER — Ambulatory Visit (INDEPENDENT_AMBULATORY_CARE_PROVIDER_SITE_OTHER): Payer: Medicare Other | Admitting: General Surgery

## 2011-08-15 ENCOUNTER — Other Ambulatory Visit: Payer: Self-pay | Admitting: Oncology

## 2011-08-15 DIAGNOSIS — Z1231 Encounter for screening mammogram for malignant neoplasm of breast: Secondary | ICD-10-CM

## 2011-08-26 ENCOUNTER — Other Ambulatory Visit: Payer: Self-pay | Admitting: *Deleted

## 2011-08-26 ENCOUNTER — Other Ambulatory Visit (HOSPITAL_BASED_OUTPATIENT_CLINIC_OR_DEPARTMENT_OTHER): Payer: Medicare Other | Admitting: Lab

## 2011-08-26 ENCOUNTER — Telehealth: Payer: Self-pay | Admitting: Oncology

## 2011-08-26 ENCOUNTER — Ambulatory Visit (HOSPITAL_BASED_OUTPATIENT_CLINIC_OR_DEPARTMENT_OTHER): Payer: Medicare Other | Admitting: Oncology

## 2011-08-26 VITALS — BP 123/81 | HR 91 | Temp 98.8°F | Ht 64.0 in | Wt 211.8 lb

## 2011-08-26 DIAGNOSIS — C50919 Malignant neoplasm of unspecified site of unspecified female breast: Secondary | ICD-10-CM

## 2011-08-26 LAB — COMPREHENSIVE METABOLIC PANEL
ALT: 13 U/L (ref 0–35)
BUN: 18 mg/dL (ref 6–23)
CO2: 28 mEq/L (ref 19–32)
Calcium: 9.2 mg/dL (ref 8.4–10.5)
Chloride: 104 mEq/L (ref 96–112)
Creatinine, Ser: 0.85 mg/dL (ref 0.50–1.10)
Glucose, Bld: 96 mg/dL (ref 70–99)
Total Bilirubin: 0.5 mg/dL (ref 0.3–1.2)

## 2011-08-26 LAB — CBC WITH DIFFERENTIAL/PLATELET
Eosinophils Absolute: 0.1 10*3/uL (ref 0.0–0.5)
HCT: 41.4 % (ref 34.8–46.6)
LYMPH%: 23.5 % (ref 14.0–49.7)
MCHC: 33.3 g/dL (ref 31.5–36.0)
MCV: 91.1 fL (ref 79.5–101.0)
MONO%: 6.3 % (ref 0.0–14.0)
NEUT#: 6.2 10*3/uL (ref 1.5–6.5)
NEUT%: 68.4 % (ref 38.4–76.8)
Platelets: 303 10*3/uL (ref 145–400)
RBC: 4.55 10*6/uL (ref 3.70–5.45)
nRBC: 0 % (ref 0–0)

## 2011-08-26 LAB — CANCER ANTIGEN 27.29: CA 27.29: 24 U/mL (ref 0–39)

## 2011-08-26 NOTE — Progress Notes (Signed)
IDFlois Jones   DOB: [birth year]  MR#: [id number]  JYN#:[id number]  HISTORY OF PRESENT ILLNESS: The patient had an abnormal screening mammogram in April 2011 and she was referred to [facility] by Dr. Vincente Poli for further evaluation.  On May 3, she had right diagnostic mammography and right breast ultrasound by Dr. Kearney Hard and additional views showed a well-defined density in the right upper outer quadrant which was not palpable.  Ultrasound showed mixed echogenicity area measuring approximately 1.6 cm, correlating well with the mammogram.  There were no cysts and ultrasound of the right axilla was unremarkable.  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  With this information the patient was referred to Dr. Carolynne Edouard and on Sep 20, 2009, bilateral breast MRIs were obtained.  In the right breast, there was an irregular spiculated mass measuring 3.2 cm which was the biopsy proven cancer. There was a separate 1.6 cm area of mass like enhancement more anterior to the prior mass, but without a bridging area of enhancement.  If both masses are taking the intervening non-enhancing parenchyma, the total measurement was 7.5 cm.  The left breast was unremarkable.  Accordingly, the patient was recalled for repeat right ultrasonography on May 13, but the second mass noted by MRI could not be located by ultrasound.  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  E-cadherin stain shows weak membranous staining.  With this information, the patient is referred for further evaluation and treatment.   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  The two areas in question accordingly may be unrelated.  INTERVAL HISTORY: The patient returns today with her husband, Savannah Jones, for followup of her breast cancer. She called [place] in October to let us know that she was having more symptoms, possibly related to her letrozole. We suggested she stop the medication for a couple of months to see how things went. There was not however a followup call from [place] and as a result she has been off treatment now for approximately 6 months.  REVIEW OF SYSTEMS: They question of course is whether or not she felt better after going off the letrozole. It is actually not easy to get an answer to that question, because there are multiple comorbidities. She has significant problems with her knees. Her legs and hips also hurt. She is gaining weight and not exercising regularly. 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 She is sore in the area of prior surgery. She was on a statin as well, and that may or may not have been contributing. That medication also was recently stopped. Again it is hard for her to tell whether or not she felt any better afterwards. Otherwise her review of systems is noncontributory  PAST MEDICAL HISTORY: Past Medical History  Diagnosis Date  . Hyperlipidemia   . IBS (irritable bowel syndrome)   . Arthritis   Significant for osteoarthritis, hypertension, history of interstitial cystitis treated  most recently by Dr. Eudelia Bunch, but not requiring any intervention currently.  She did have some irrigation treatments in the past and she has chronic microscopic hematuria as a result of that.  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  She is status post tonsillectomy, status post laparoscopic "surgery" looking for endometriosis to try to explain her difficulty getting pregnant, but she did not have endometriosis.  She has hypercholesterolemia, GERD which she describes as mild, history of irritable bowel syndrome associated with stress, history of squamous cell skin cancers removed by Dr. Yetta Barre.  She has a history of tobacco abuse of a little less than 10 pack years.  PAST SURGICAL HISTORY: Past Surgical History  Procedure Date  . Mastectomy 03/05/10    negative sentinel bx, T1c no right breast cancer    FAMILY HISTORY The patient's father died at the age of 31 from a subarachnoid hemorrhage.  The patient's mother died at the age of 46 from complications of diabetes.  The patient is an only child.  GYNECOLOGIC HISTORY: She is Gx, P2, first pregnancy to term age 64.  She went through the change of life around age 47 and she never took hormones.  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  Her husband Savannah Jones, is a retired Chartered loss adjuster.  He used to Regulatory affairs officer.  Son Christiane Ha works for [employer] in [place].  Daughter Irving Burton  plans to be a Copy.  The patient has 1 grandchild on the way. She attends the [employer].   ADVANCED DIRECTIVES:  HEALTH MAINTENANCE: History  Substance Use Topics  . Smoking status: Not on file  . Smokeless tobacco: Not on file  . Alcohol Use:      Colonoscopy:  PAP:  Bone density: 02/05/2011/[facility]/normal  Lipid panel:  Allergies  Allergen Reactions  . Sulfa Antibiotics     Obtain reaction/severity from patient.    Current Outpatient Prescriptions  Medication Sig Dispense Refill  . amLODipine (NORVASC) 10 MG tablet Take 10 mg by mouth daily.        Marland Kitchen lovastatin (MEVACOR) 40 MG tablet Take 40 mg by mouth at bedtime.        . ramipril (ALTACE) 10 MG capsule Take 10 mg by mouth daily.           OBJECTIVE: middle-aged white woman in no acute distress There were no vitals filed for this visit.   There is no height or weight on file to calculate BMI.    ECOG FS:  Sclerae unicteric Oropharynx clear No peripheral adenopathy Lungs no rales or rhonchi Heart regular rate and rhythm Abd benign MSK no focal spinal tenderness, no peripheral edema Neuro: nonfocal Breasts: the right breast is status postmastectomy. There is no evidence of local recurrence. Left breast is unremarkable  LAB RESULTS: Lab Results  Component Value Date   WBC 5.9 02/18/2011   NEUTROABS 3.2 02/18/2011   HGB 13.8 02/18/2011   HCT 41.1 02/18/2011   MCV 92.3 02/18/2011   PLT 279 02/18/2011      Chemistry      Component Value Date/Time   NA 141 02/18/2011 1052   NA 141 02/18/2011 1052   NA 141 02/18/2011 1052   K 3.6 02/18/2011 1052   K 3.6 02/18/2011 1052  K 3.6 02/18/2011 1052   CL 104 02/18/2011 1052   CL 104 02/18/2011 1052   CL 104 02/18/2011 1052   CO2 27 02/18/2011 1052   CO2 27 02/18/2011 1052   CO2 27 02/18/2011 1052   BUN 13 02/18/2011 1052   BUN 13 02/18/2011 1052   BUN 13 02/18/2011 1052   CREATININE 0.86 02/18/2011 1052   CREATININE 0.86 02/18/2011 1052   CREATININE 0.86 02/18/2011 1052      Component Value Date/Time   CALCIUM 9.3 02/18/2011 1052   CALCIUM 9.3 02/18/2011 1052   CALCIUM 9.3 02/18/2011 1052   ALKPHOS 76 02/18/2011 1052   ALKPHOS 76 02/18/2011 1052   ALKPHOS 76 02/18/2011 1052   AST 17 02/18/2011 1052   AST 17 02/18/2011 1052   AST 17 02/18/2011 1052   ALT 16 02/18/2011 1052   ALT 16 02/18/2011 1052   ALT 16 02/18/2011 1052   BILITOT 0.4 02/18/2011 1052   BILITOT 0.4 02/18/2011 1052   BILITOT 0.4 02/18/2011 1052       Lab Results  Component Value Date   LABCA2 23 02/18/2011   LABCA2 23 02/18/2011    No components found with this basename: [id number]    No results found for this basename: INR:1;PROTIME:1 in the last 168 hours  Urinalysis    Component Value Date/Time    COLORURINE AMBER BIOCHEMICALS MAY BE AFFECTED BY COLOR* 02/02/2010 1429   APPEARANCEUR TURBID* 02/02/2010 1429   LABSPEC 1.025 02/12/2010 0927   LABSPEC 1.028 02/02/2010 1429   PHURINE 6.0 02/02/2010 1429   GLUCOSEU 100* 02/02/2010 1429   HGBUR NEGATIVE 02/02/2010 1429   BILIRUBINUR SMALL* 02/02/2010 1429   KETONESUR 40* 02/02/2010 1429   PROTEINUR >300* 02/02/2010 1429   UROBILINOGEN 1.0 02/02/2010 1429   NITRITE NEGATIVE 02/02/2010 1429   LEUKOCYTESUR TRACE* 02/02/2010 1429    STUDIES: No results found. Left mammogram is scheduled for next month  ASSESSMENT: 66 year old [place] woman   (1) status post right breast biopsy May of 2011 for an invasive ductal carcinoma, estrogen receptor positive at 100%, but progesterone receptor negative at 0%, with a low proliferation fraction at 11% and an equivocal result by CISH for HER2/neu amplification, with a ratio of 2.04.  (2)  treated neoadjuvantly with 5 cycles of docetaxel/ carboplatin/ trastuzumab, completed October of 2011, the 6th cycle held because of neuropathy,   (3) status post right mastectomy and sentinel lymph node sampling October of 2011 for what proved to be a ypT1c ypN0, strongly ER positive, weakly PR positive, clearly HER2 negative invasive ductal carcinoma with a low proliferation fraction.  She did not require postmastectomy radiation.   (4) on letrozole January to October of 2012, discontinued because of arthralgias, which may or may not be related.  PLAN: as discussed above, it is difficult to sort out whether the symptoms Annice Pih has been experiencing are related to either letrozole or her statin. The bottom line as far as I can tell is that her symptoms really did not improve significantly when she went off these medications. She did "feel better", when she went off, but only minimally so. I think the symptoms she is having in her knees and hips are fairly classic for arthritis. They are worsened by weight gain. It would be ideal  issue started an exercise and diet program and I discussed long-term plan is for her colon where does she want to be in 5 years where she will be in 10 years and how it she going to  get from here to there.  In the meantime we reviewed her risk of recurrence, which would be 51% with local treatment only, decreased by 11% with chemotherapy to a residual 40% and it is unclear whether Herceptin was beneficial R. Not so we cannot rely on that, though I still think it was a correct decision to give further treatments she did receive. This means that if she took antiestrogen therapy for 5 years she would have an approximately 20% risk reduction as far as her recurrence is concerned.  This is a significant benefit and should be a strong motivator. We then discussed her treatment options including tamoxifen, and anastrozole, exemestane, or going back to letrozole. After much discussion she decided she would like to go back letrozole and I wrote her that prescription. I suggested she keep a symptom diary everyday starting now so that she can tell 2 or 3 months from now with her she is actually feeling any differently in general. She is going to see me again in 3 months to assess tolerance. She knows to call for any problems that may develop before that visit   Ziyan Hillmer C    08/26/2011

## 2011-08-26 NOTE — Telephone Encounter (Signed)
gve the pt her July 2013 appts °

## 2011-09-04 ENCOUNTER — Encounter (INDEPENDENT_AMBULATORY_CARE_PROVIDER_SITE_OTHER): Payer: Self-pay | Admitting: General Surgery

## 2011-09-11 ENCOUNTER — Ambulatory Visit (INDEPENDENT_AMBULATORY_CARE_PROVIDER_SITE_OTHER): Payer: Medicare Other | Admitting: General Surgery

## 2011-09-11 ENCOUNTER — Encounter (INDEPENDENT_AMBULATORY_CARE_PROVIDER_SITE_OTHER): Payer: Self-pay | Admitting: General Surgery

## 2011-09-11 VITALS — BP 141/88 | HR 97 | Temp 98.4°F | Ht 64.5 in | Wt 214.8 lb

## 2011-09-11 DIAGNOSIS — C50919 Malignant neoplasm of unspecified site of unspecified female breast: Secondary | ICD-10-CM

## 2011-09-11 NOTE — Progress Notes (Signed)
Subjective:     Patient ID: Savannah Jones, female   DOB: 1945-08-10, 66 y.o.   MRN: [id number]  HPI The patient is a 66 year old white female who is one and a half years status post right mastectomy and negative sentinel node biopsy for a T1 C. N0 right breast cancer. Since her last visit she has had no medical issues. She still has some mild soreness on her right chest wall. Last year she was having some significant bone pain so her Femara was stopped in October. She restarted the Femara in April of this year and seems to be okay right now. She is scheduled for her annual mammogram in the next week or so  Review of Systems  Constitutional: Negative.   HENT: Negative.   Eyes: Negative.   Respiratory: Negative.   Cardiovascular: Negative.   Gastrointestinal: Negative.   Genitourinary: Negative.   Musculoskeletal: Positive for arthralgias.  Skin: Negative.   Neurological: Negative.   Hematological: Negative.   Psychiatric/Behavioral: Negative.        Objective:   Physical Exam  Constitutional: She is oriented to person, place, and time. She appears well-developed and well-nourished.  HENT:  Head: Normocephalic and atraumatic.  Eyes: Conjunctivae and EOM are normal. Pupils are equal, round, and reactive to light.  Neck: Normal range of motion. Neck supple.  Cardiovascular: Normal rate, regular rhythm and normal heart sounds.   Pulmonary/Chest: Effort normal and breath sounds normal.       There is no palpable mass of the right chest wall. There is no palpable mass in the left breast. No axillary supraclavicular or cervical lymphadenopathy  Abdominal: Soft. Bowel sounds are normal. She exhibits no mass. There is no tenderness.  Musculoskeletal: Normal range of motion.  Lymphadenopathy:    She has no cervical adenopathy.  Neurological: She is alert and oriented to person, place, and time.  Skin: Skin is warm and dry.  Psychiatric: She has a normal mood and affect. Her behavior is  normal.       Assessment:     1-1/2 years status post right mastectomy and negative sentinel node biopsy    Plan:     At this point she seems to be doing well. She will continue her for more until she sees Dr. Darnelle Catalan again. She will continue to do regular self exams and we will followup on her mammogram in the next week or 2.Otherwise we will see her back in about 6 months

## 2011-09-11 NOTE — Patient Instructions (Signed)
Continue regular self exams Continue femara for now

## 2011-09-23 ENCOUNTER — Ambulatory Visit
Admission: RE | Admit: 2011-09-23 | Discharge: 2011-09-23 | Disposition: A | Discharge status: Home / Self Care | Payer: Medicare Other | Source: Ambulatory Visit | Attending: Oncology | Admitting: Oncology

## 2011-09-23 DIAGNOSIS — Z1231 Encounter for screening mammogram for malignant neoplasm of breast: Secondary | ICD-10-CM

## 2011-09-24 ENCOUNTER — Telehealth (INDEPENDENT_AMBULATORY_CARE_PROVIDER_SITE_OTHER): Payer: Self-pay | Admitting: General Surgery

## 2011-09-24 NOTE — Telephone Encounter (Signed)
Spoke with the pt and informed her that her mammogram looked negative and that everything looked good.

## 2011-09-24 NOTE — Telephone Encounter (Signed)
Message copied by Littie Deeds on Wed Sep 24, 2011 10:40 AM ------      Message from: Caleen Essex III      Created: Wed Sep 24, 2011 10:30 AM       Looks neg

## 2011-09-24 NOTE — Progress Notes (Signed)
Savannah Jones completed  

## 2011-12-02 ENCOUNTER — Other Ambulatory Visit: Payer: Self-pay | Admitting: Obstetrics and Gynecology

## 2011-12-04 ENCOUNTER — Ambulatory Visit (HOSPITAL_BASED_OUTPATIENT_CLINIC_OR_DEPARTMENT_OTHER): Payer: Medicare Other | Admitting: Oncology

## 2011-12-04 ENCOUNTER — Telehealth: Payer: Self-pay | Admitting: Oncology

## 2011-12-04 ENCOUNTER — Other Ambulatory Visit (HOSPITAL_BASED_OUTPATIENT_CLINIC_OR_DEPARTMENT_OTHER): Payer: Medicare Other | Admitting: Lab

## 2011-12-04 VITALS — BP 136/84 | HR 88 | Temp 98.4°F | Ht 64.5 in | Wt 210.0 lb

## 2011-12-04 DIAGNOSIS — C50919 Malignant neoplasm of unspecified site of unspecified female breast: Secondary | ICD-10-CM

## 2011-12-04 LAB — CBC WITH DIFFERENTIAL/PLATELET
BASO%: 1.2 % (ref 0.0–2.0)
EOS%: 1.5 % (ref 0.0–7.0)
HCT: 40.9 % (ref 34.8–46.6)
LYMPH%: 29.3 % (ref 14.0–49.7)
MCH: 30.5 pg (ref 25.1–34.0)
MCHC: 33.5 g/dL (ref 31.5–36.0)
MONO%: 7 % (ref 0.0–14.0)
NEUT%: 61 % (ref 38.4–76.8)
Platelets: 276 10*3/uL (ref 145–400)
lymph#: 2.1 10*3/uL (ref 0.9–3.3)

## 2011-12-04 LAB — COMPREHENSIVE METABOLIC PANEL
ALT: 17 U/L (ref 0–35)
AST: 18 U/L (ref 0–37)
Alkaline Phosphatase: 76 U/L (ref 39–117)
Creatinine, Ser: 0.85 mg/dL (ref 0.50–1.10)
Total Bilirubin: 0.4 mg/dL (ref 0.3–1.2)

## 2011-12-04 NOTE — Progress Notes (Signed)
IDKaylinn Jones   DOB: 1946/02/27  MR#: [id number]  HYQ#:[id number]  HISTORY OF PRESENT ILLNESS: The patient had an abnormal screening mammogram in April 2011 and she was referred to [facility] by Dr. Vincente Jones for further evaluation.  On May 3, she had right diagnostic mammography and right breast ultrasound by Dr. Kearney Jones and additional views showed a well-defined density in the right upper outer quadrant which was not palpable.  Ultrasound showed mixed echogenicity area measuring approximately 1.6 cm, correlating well with the mammogram.  There were no cysts and ultrasound of the right axilla was unremarkable.  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  With this information the patient was referred to Dr. Carolynne Jones and on Sep 20, 2009, bilateral breast MRIs were obtained.  In the right breast, there was an irregular spiculated mass measuring 3.2 cm which was the biopsy proven cancer. There was a separate 1.6 cm area of mass like enhancement more anterior to the prior mass, but without a bridging area of enhancement.  If both masses are taking the intervening non-enhancing parenchyma, the total measurement was 7.5 cm.  The left breast was unremarkable.  Accordingly, the patient was recalled for repeat right ultrasonography on May 13, but the second mass noted by MRI could not be located by ultrasound.  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  E-cadherin stain shows weak membranous staining.  With this information, the patient is referred for further evaluation and treatment.   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  The two areas in question accordingly may be unrelated.  INTERVAL HISTORY: The patient returns today with her husband, Savannah Jones, for followup of her breast cancer. At the last visit we went back on the letrozole, since it was not clear that her discomfort was related to that medicine. In fact she has pretty much decided she just doesn't feel particularly well whether or not she is on letrozole. The plan accordingly is to continue that for a minimum of 5 years.  REVIEW OF SYSTEMS: She had sinus problems with of elevated temperatures (for her; the highest temperature was 98 point 9 in the evening), and this lasted about 6-8 weeks. She treated by increasing water intake. She did not take Claritin or other antihistamines. She had no significant cough, phlegm production, pleurisy, or shortness of breath. She continues to have arthritis problems and these keep her from walking as much as she would like. She is not doing a stationary bike or water aerobics and does not belong to a club. Overall she is doing "pretty good". A detailed review of systems is otherwise noncontributory  PAST MEDICAL HISTORY: Past Medical History  Diagnosis Date  . Hyperlipidemia   . IBS (irritable bowel syndrome)   . Arthritis   Significant for osteoarthritis, hypertension, history of interstitial cystitis treated most recently by Savannah Jones, but not requiring any intervention currently.  She did have some irrigation treatments in the past and she  has chronic microscopic hematuria as a result of that.  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  She is status post tonsillectomy, status post laparoscopic  "surgery" looking for endometriosis to try to explain her difficulty getting pregnant, but she did not have endometriosis.  She has hypercholesterolemia, GERD which she describes as mild, history of irritable bowel syndrome associated with stress, history of squamous cell skin cancers removed by Dr. Yetta Jones.  She has a history of tobacco abuse of a little less than 10 pack years.  PAST SURGICAL HISTORY: Past Surgical History  Procedure Date  . Mastectomy 03/05/10    negative sentinel bx, T1c no right breast cancer  . Breast surgery     FAMILY HISTORY The patient's father died at the age of 14 from a subarachnoid hemorrhage.  The patient's mother died at the age of 62 from complications of diabetes.  The patient is an only child.  GYNECOLOGIC HISTORY: She is Gx, P2, first pregnancy to term age 45.  She went through the change of life around age 26 and she never took hormones.  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  Her husband Savannah Jones, is a retired Chartered loss adjuster.  He used to Regulatory affairs officer.  Son Savannah Jones works for [employer] in [place].  Daughter Savannah Jones  plans to be a Copy.  The patient has 1 grandchild on the way. She attends the [employer].   ADVANCED DIRECTIVES:  HEALTH MAINTENANCE: History  Substance Use Topics  . Smoking status: Never Smoker   . Smokeless tobacco: Not on file  . Alcohol Use: No     Colonoscopy:  PAP:  Bone density: 02/05/2011/[facility]/normal  Lipid panel:  Allergies  Allergen Reactions  . Sulfa Antibiotics     Obtain reaction/severity from patient.    Current Outpatient Prescriptions  Medication Sig Dispense Refill  . amLODipine (NORVASC) 10 MG tablet Take 10 mg by mouth daily.        Marland Kitchen letrozole (FEMARA) 2.5 MG tablet Take 2.5 mg by mouth daily.      . ramipril (ALTACE) 10 MG capsule Take 10 mg by mouth daily.        . fluocinonide cream (LIDEX) 0.05 %         OBJECTIVE: middle-aged white woman who  appears well Filed Vitals:   12/04/11 1125  BP: 136/84  Pulse: 88  Temp: 98.4 F (36.9 C)     Body mass index is 35.49 kg/(m^2).    ECOG FS: 1  Sclerae unicteric Oropharynx clear; both tympanic membranes are pearly with normal light reflex No peripheral adenopathy Lungs no rales or rhonchi Heart regular rate and rhythm Abd obese, benign MSK no focal spinal tenderness Neuro: nonfocal Breasts: the right breast is status postmastectomy. There is no evidence of local recurrence. Left breast is unremarkable  LAB RESULTS: Lab Results  Component Value Date   WBC 7.0 12/04/2011   NEUTROABS 4.3 12/04/2011   HGB 13.7 12/04/2011   HCT 40.9 12/04/2011   MCV 90.8 12/04/2011   PLT 276 12/04/2011      Chemistry      Component Value Date/Time   NA 138 08/26/2011 1323   K 3.9 08/26/2011 1323   CL 104 08/26/2011 1323   CO2 28 08/26/2011 1323   BUN 18  08/26/2011 1323   CREATININE 0.85 08/26/2011 1323      Component Value Date/Time   CALCIUM 9.2 08/26/2011 1323   ALKPHOS 82 08/26/2011 1323   AST 15 08/26/2011 1323   ALT 13 08/26/2011 1323   BILITOT 0.5 08/26/2011 1323       Lab Results  Component Value Date   LABCA2 24 08/26/2011    No components found with this basename: [id number]    No results found for this basename: INR:1;PROTIME:1 in the last 168 hours  Urinalysis    Component Value Date/Time   COLORURINE AMBER BIOCHEMICALS MAY BE AFFECTED BY COLOR* 02/02/2010 1429   APPEARANCEUR TURBID* 02/02/2010 1429   LABSPEC 1.025 02/12/2010 0927   LABSPEC 1.028 02/02/2010 1429   PHURINE 6.0 02/02/2010 1429   GLUCOSEU 100* 02/02/2010 1429   HGBUR NEGATIVE 02/02/2010 1429   BILIRUBINUR SMALL* 02/02/2010 1429   KETONESUR 40* 02/02/2010 1429   PROTEINUR >300* 02/02/2010 1429   UROBILINOGEN 1.0 02/02/2010 1429   NITRITE NEGATIVE 02/02/2010 1429   LEUKOCYTESUR TRACE* 02/02/2010 1429    STUDIES: Mammography Sep 23 2011 was unremarkable. She had a normal bone density September 2012  ASSESSMENT: 67  y.o.  [place] woman   (1) status post right breast biopsy May of 2011 for an invasive ductal carcinoma, estrogen receptor positive at 100%, but progesterone receptor negative at 0%, with a low proliferation fraction at 11% and an equivocal result by [facility] for HER2/neu amplification, with a ratio of 2.04.  (2)  treated neoadjuvantly with 5 cycles of docetaxel/ carboplatin/ trastuzumab, completed October of 2011, the 6th cycle held because of neuropathy,   (3) status post right mastectomy and sentinel lymph node sampling October of 2011 for what proved to be a ypT1c ypN0, strongly ER positive, weakly PR positive, clearly HER2 negative invasive ductal carcinoma with a low proliferation fraction.  She did not require postmastectomy radiation.   (4) on letrozole January to October of 2012, discontinued because of arthralgias, definitively resumed May 2013  PLAN: I think Jazarah iis correct to conclude that she just doesn't feel well many days, but that the letrozole is not the reason for that. The plan is to continue this drug for an additional 5 years. She will see Dr. Carolynne Jones in October, so she will see me again in January. At that point we will consider starting a once a year visits if everything is going well.  I should note she just had her first grandson, a week ago, Memory [employer]. She and Everlean Cherry. a ready trying to decide what they want to be called. Also, Makailee just received the bill from [place] from about 2 years ago. I have advised her to discuss this with our billing counselors.  Janee Ureste C    12/04/2011

## 2011-12-04 NOTE — Telephone Encounter (Signed)
gve the pt her jan 2014 appt calendar °

## 2012-02-24 ENCOUNTER — Ambulatory Visit (INDEPENDENT_AMBULATORY_CARE_PROVIDER_SITE_OTHER): Payer: Medicare Other | Admitting: General Surgery

## 2012-02-24 ENCOUNTER — Encounter: Payer: Self-pay | Admitting: Oncology

## 2012-02-24 NOTE — Progress Notes (Signed)
Called patient back twice. She had left message on Darlena's vmail also. Did advise her that Lanora Manis had pulled the bills for her and all needed to be sent to billing for review. I told her once I found out I would call her back and let her know either way.

## 2012-02-27 ENCOUNTER — Encounter (INDEPENDENT_AMBULATORY_CARE_PROVIDER_SITE_OTHER): Payer: Self-pay | Admitting: General Surgery

## 2012-02-27 ENCOUNTER — Ambulatory Visit (INDEPENDENT_AMBULATORY_CARE_PROVIDER_SITE_OTHER): Payer: Medicare Other | Admitting: General Surgery

## 2012-02-27 VITALS — BP 128/70 | HR 92 | Temp 97.4°F | Ht 64.5 in | Wt 210.2 lb

## 2012-02-27 DIAGNOSIS — C50919 Malignant neoplasm of unspecified site of unspecified female breast: Secondary | ICD-10-CM

## 2012-02-27 NOTE — Patient Instructions (Signed)
Continue regular self exams  

## 2012-03-18 ENCOUNTER — Encounter: Payer: Self-pay | Admitting: Oncology

## 2012-03-18 NOTE — Progress Notes (Signed)
I sent email to [facility] to see who can look up billing from 2011. See her response: I am just back from being gone 5 weeks. I am going through the e-mails. I wanted to let you know we have no way of looking up anything past 06/2010. I apologize I am unable to help with this one.  Will inquire who can access 2011 files/system

## 2012-04-07 ENCOUNTER — Encounter (INDEPENDENT_AMBULATORY_CARE_PROVIDER_SITE_OTHER): Payer: Self-pay | Admitting: General Surgery

## 2012-04-07 NOTE — Progress Notes (Signed)
Subjective:     Patient ID: Savannah Jones, female   DOB: 1945/12/18, 66 y.o.   MRN: [id number]  HPI The patient is 2 years status post right mastectomy and negative sentinel node biopsy for T1 C. N0 right breast cancer. Since her last visit she has been well. She has no complaints today. She denies any chest wall or breast pain. She denies any discharge or nipple. She is continuing to take Femara and seems to be tolerating that well.  Review of Systems  Constitutional: Negative.   HENT: Negative.   Eyes: Negative.   Respiratory: Negative.   Cardiovascular: Negative.   Gastrointestinal: Negative.   Genitourinary: Negative.   Musculoskeletal: Negative.   Skin: Negative.   Neurological: Negative.   Hematological: Negative.   Psychiatric/Behavioral: Negative.        Objective:   Physical Exam  Constitutional: She is oriented to person, place, and time. She appears well-developed and well-nourished.  HENT:  Head: Normocephalic and atraumatic.  Eyes: Conjunctivae normal and EOM are normal. Pupils are equal, round, and reactive to light.  Neck: Normal range of motion. Neck supple.  Cardiovascular: Normal rate, regular rhythm and normal heart sounds.   Pulmonary/Chest: Effort normal and breath sounds normal.       There is no palpable mass of the right chest wall. There is no palpable mass of the left breast. There is no palpable axillary or supraclavicular cervical lymphadenopathy.  Abdominal: Soft. Bowel sounds are normal. She exhibits no mass. There is no tenderness.  Musculoskeletal: Normal range of motion.  Lymphadenopathy:    She has no cervical adenopathy.  Neurological: She is alert and oriented to person, place, and time.  Skin: Skin is warm and dry.  Psychiatric: She has a normal mood and affect. Her behavior is normal.       Assessment:     2 years status post right mastectomy for breast cancer    Plan:     At this point she will continue to do regular self  exams. She will continue to take Femara. We will plan to see her back in about 6 months.

## 2012-06-07 ENCOUNTER — Other Ambulatory Visit: Payer: Self-pay | Admitting: *Deleted

## 2012-06-07 DIAGNOSIS — C50919 Malignant neoplasm of unspecified site of unspecified female breast: Secondary | ICD-10-CM

## 2012-06-08 ENCOUNTER — Ambulatory Visit (HOSPITAL_BASED_OUTPATIENT_CLINIC_OR_DEPARTMENT_OTHER): Payer: Medicare Other | Admitting: Oncology

## 2012-06-08 ENCOUNTER — Telehealth: Payer: Self-pay | Admitting: Oncology

## 2012-06-08 ENCOUNTER — Other Ambulatory Visit (HOSPITAL_BASED_OUTPATIENT_CLINIC_OR_DEPARTMENT_OTHER): Payer: Medicare Other | Admitting: Lab

## 2012-06-08 VITALS — BP 138/82 | HR 86 | Temp 97.5°F | Resp 20 | Ht 64.5 in | Wt 207.7 lb

## 2012-06-08 DIAGNOSIS — C50419 Malignant neoplasm of upper-outer quadrant of unspecified female breast: Secondary | ICD-10-CM

## 2012-06-08 DIAGNOSIS — Z9010 Acquired absence of unspecified breast and nipple: Secondary | ICD-10-CM

## 2012-06-08 DIAGNOSIS — C50919 Malignant neoplasm of unspecified site of unspecified female breast: Secondary | ICD-10-CM

## 2012-06-08 DIAGNOSIS — Z170 Estrogen receptor positive status [ER+]: Secondary | ICD-10-CM

## 2012-06-08 LAB — COMPREHENSIVE METABOLIC PANEL (CC13)
ALT: 21 U/L (ref 0–55)
AST: 17 U/L (ref 5–34)
Albumin: 4 g/dL (ref 3.5–5.0)
Alkaline Phosphatase: 96 U/L (ref 40–150)
Calcium: 9.5 mg/dL (ref 8.4–10.4)
Chloride: 100 mEq/L (ref 98–107)
Potassium: 3.8 mEq/L (ref 3.5–5.1)
Sodium: 138 mEq/L (ref 136–145)
Total Protein: 7.8 g/dL (ref 6.4–8.3)

## 2012-06-08 LAB — CBC WITH DIFFERENTIAL/PLATELET
Basophils Absolute: 0.1 10*3/uL (ref 0.0–0.1)
EOS%: 2 % (ref 0.0–7.0)
Eosinophils Absolute: 0.2 10*3/uL (ref 0.0–0.5)
HCT: 42.6 % (ref 34.8–46.6)
HGB: 14.5 g/dL (ref 11.6–15.9)
MCH: 30.6 pg (ref 25.1–34.0)
MCV: 89.6 fL (ref 79.5–101.0)
MONO%: 7 % (ref 0.0–14.0)
NEUT%: 59.8 % (ref 38.4–76.8)

## 2012-06-08 MED ORDER — LETROZOLE 2.5 MG PO TABS: 2.5000 mg | ORAL_TABLET | Freq: Every day | ORAL | Status: DC

## 2012-06-08 NOTE — Progress Notes (Signed)
IDTrenese Jones   DOB: 06/26/1945  MR#: [id number]  FAO#:[id number]  HISTORY OF PRESENT ILLNESS: The patient had an abnormal screening mammogram in April 2011 and she was referred [facility] by Dr. Vincente Poli for further evaluation.  On May 3, she had right diagnostic mammography and right breast ultrasound by Dr. Kearney Hard and additional views showed a well-defined density in the right upper outer quadrant which was not palpable.  Ultrasound showed mixed echogenicity area measuring approximately 1.6 cm, correlating well with the mammogram.  There were no cysts and ultrasound of the right axilla was unremarkable.  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  With this information the patient was referred to Dr. Carolynne Edouard and on Sep 20, 2009, bilateral breast MRIs were obtained.  In the right breast, there was an irregular spiculated mass measuring 3.2 cm which was the biopsy proven cancer. There was a separate 1.6 cm area of mass like enhancement more anterior to the prior mass, but without a bridging area of enhancement.  If both masses are taking the intervening non-enhancing parenchyma, the total measurement was 7.5 cm.  The left breast was unremarkable.  Accordingly, the patient was recalled for repeat right ultrasonography on May 13, but the second mass noted by MRI could not be located by ultrasound.  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  E-cadherin stain shows weak membranous staining.  With this information, the patient is referred for further evaluation and treatment.   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  The two areas in question accordingly may be unrelated.  INTERVAL HISTORY: The patient returns today with her husband, Savannah Jones, for followup of her breast cancer. They just got back from a week in [place], where [place] enjoy playing golf. Their grandson is now 76 months old and has not "named them" yet.  REVIEW OF SYSTEMS: She continues to have pains in her hip knees and feet. She did see a podiatrist, Dr. Theo Dills, and is now using orthotics. She hopes this will help. In the meantime she is not exercising at all. She has some rosacea but no hot flashes or vaginal dryness problems from the letrozole. A detailed review of systems today was otherwise noncontributory.  PAST MEDICAL HISTORY: Past Medical History  Diagnosis Date  . Hyperlipidemia   . IBS (irritable bowel syndrome)   . Arthritis   Significant for osteoarthritis, hypertension, history of interstitial cystitis treated most recently by Dr. Eudelia Bunch, but not requiring any intervention currently.  She did have some irrigation treatments in the past and she has chronic microscopic hematuria as a result of that.  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  She is status post tonsillectomy, status post laparoscopic "surgery" looking for endometriosis to try to explain her difficulty getting pregnant,  but she did not have endometriosis.  She has hypercholesterolemia, GERD which she describes as mild, history of irritable bowel syndrome associated with stress, history of squamous cell skin cancers removed by Dr. Yetta Barre.  She has a history of tobacco abuse of a little less than 10 pack years.  PAST SURGICAL HISTORY: Past  Surgical History  Procedure Date  . Mastectomy 03/05/10    negative sentinel bx, T1c no right breast cancer  . Breast surgery     FAMILY HISTORY The patient's father died at the age of 59 from a subarachnoid hemorrhage.  The patient's mother died at the age of 90 from complications of diabetes.  The patient is an only child.  GYNECOLOGIC HISTORY: She is Gx, P2, first pregnancy to term age 48.  She went through the change of life around age 43 and she never took hormones.  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  Her husband Savannah Jones, is a retired Chartered loss adjuster.  He used to Regulatory affairs officer.  Son Christiane Ha works for [employer] in [place].  Daughter Irving Burton  plans to be a Copy.  The patient has 1 grandson. She attends a [employer].   ADVANCED DIRECTIVES:  HEALTH MAINTENANCE: History  Substance Use Topics  . Smoking status: Former Games developer  . Smokeless tobacco: Former Neurosurgeon    Quit date: 02/26/1974  . Alcohol Use: No     Colonoscopy:  PAP:  Bone density: 02/05/2011/[facility]/normal  Lipid panel:  Allergies  Allergen Reactions  . Sulfa Antibiotics     Obtain reaction/severity from patient.    Current Outpatient Prescriptions  Medication Sig Dispense Refill  . amLODipine (NORVASC) 10 MG tablet Take 10 mg by mouth daily.        Marland Kitchen letrozole (FEMARA) 2.5 MG tablet Take 2.5 mg by mouth daily.      Marland Kitchen lovastatin (MEVACOR) 40 MG tablet       . ramipril (ALTACE) 10 MG capsule Take 10 mg by mouth daily.          OBJECTIVE: middle-aged white woman in no acute distress Filed Vitals:   06/08/12 1115  BP: 138/82  Pulse: 86  Temp: 97.5 F (36.4 C)  Resp: 20     Body mass index is 35.10 kg/(m^2).    ECOG FS: 1  Sclerae unicteric Oropharynx clear No peripheral adenopathy Lungs no rales or rhonchi Heart regular rate and rhythm Abd obese, benign MSK no focal spinal tenderness, no peripheral edema Neuro: nonfocal, well oriented Breasts:  the right breast is status postmastectomy. There is no evidence of local recurrence. The right axilla is benign. Left breast is unremarkable  LAB RESULTS: Lab Results  Component Value Date   WBC 8.2 06/08/2012   NEUTROABS 4.9 06/08/2012   HGB 14.5 06/08/2012   HCT 42.6 06/08/2012   MCV 89.6 06/08/2012   PLT 300 06/08/2012      Chemistry      Component Value Date/Time   NA 141 12/04/2011 1112   K 4.4 12/04/2011 1112   CL 103 12/04/2011 1112   CO2 30 12/04/2011 1112   BUN 13 12/04/2011 1112   CREATININE 0.85 12/04/2011 1112      Component Value Date/Time   CALCIUM 9.6 12/04/2011 1112   ALKPHOS 76 12/04/2011 1112   AST 18 12/04/2011 1112   ALT 17 12/04/2011 1112   BILITOT 0.4 12/04/2011 1112       Lab Results  Component Value Date   LABCA2 24 12/04/2011  No components found with this basename: [id number]    No results found for this basename: INR:1;PROTIME:1 in the last 168 hours  Urinalysis    Component Value Date/Time   COLORURINE AMBER BIOCHEMICALS MAY BE AFFECTED BY COLOR* 02/02/2010 1429   APPEARANCEUR TURBID* 02/02/2010 1429   LABSPEC 1.025 02/12/2010 0927   LABSPEC 1.028 02/02/2010 1429   PHURINE 6.0 02/02/2010 1429   GLUCOSEU 100* 02/02/2010 1429   HGBUR NEGATIVE 02/02/2010 1429   BILIRUBINUR SMALL* 02/02/2010 1429   KETONESUR 40* 02/02/2010 1429   PROTEINUR >300* 02/02/2010 1429   UROBILINOGEN 1.0 02/02/2010 1429   NITRITE NEGATIVE 02/02/2010 1429   LEUKOCYTESUR TRACE* 02/02/2010 1429    STUDIES: Mammography May 2013 was unremarkable. She had a normal bone density September 2012  ASSESSMENT: 67 y.o.  [place] woman   (1) status post right breast biopsy May of 2011 for an invasive ductal carcinoma, estrogen receptor positive at 100%, but progesterone receptor negative at 0%, with a low proliferation fraction at 11% and an equivocal result by [facility] for HER2/neu amplification, with a ratio of 2.04.  (2)  treated neoadjuvantly with 5 cycles of docetaxel/ carboplatin/  trastuzumab, completed October of 2011, the 6th cycle held because of neuropathy,   (3) status post right mastectomy and sentinel lymph node sampling October of 2011 for what proved to be a ypT1c ypN0, strongly ER positive, weakly PR positive, clearly HER2 negative invasive ductal carcinoma with a low proliferation fraction.  She did not require postmastectomy radiation.   (4) on letrozole January to October of 2012, discontinued because of arthralgias, definitively resumed May 2013  PLAN: I think Margarita Sermons is tolerating the letrozole well. I wish you would exercise some and I offered her participation in our yoga class here [facility] [place] [facility] I think either of these options would make her pains feel better. She had many questions regarding a possible association between Norvasc and breast cancer, which I do not think is well documented. She is going to see her surgeon in 3 months. She will see [place] again in 6 months. She knows to call for any problems that may develop before that visit. MAGRINAT,GUSTAV C    06/08/2012

## 2012-06-08 NOTE — Telephone Encounter (Signed)
gv pt appt schedule for July. No pof. Per [facility] lb/AB 6mos. Pt requests lb/AB same day.

## 2012-08-27 ENCOUNTER — Other Ambulatory Visit: Payer: Self-pay

## 2012-08-27 DIAGNOSIS — Z9011 Acquired absence of right breast and nipple: Secondary | ICD-10-CM

## 2012-08-27 DIAGNOSIS — Z1231 Encounter for screening mammogram for malignant neoplasm of breast: Secondary | ICD-10-CM

## 2012-08-31 ENCOUNTER — Ambulatory Visit (INDEPENDENT_AMBULATORY_CARE_PROVIDER_SITE_OTHER): Payer: Medicare Other | Admitting: General Surgery

## 2012-08-31 ENCOUNTER — Encounter (INDEPENDENT_AMBULATORY_CARE_PROVIDER_SITE_OTHER): Payer: Self-pay | Admitting: General Surgery

## 2012-08-31 VITALS — BP 110/66 | HR 68 | Temp 98.7°F | Resp 12 | Ht 64.0 in | Wt 207.0 lb

## 2012-08-31 DIAGNOSIS — C50911 Malignant neoplasm of unspecified site of right female breast: Secondary | ICD-10-CM

## 2012-08-31 DIAGNOSIS — C50919 Malignant neoplasm of unspecified site of unspecified female breast: Secondary | ICD-10-CM

## 2012-08-31 NOTE — Patient Instructions (Signed)
Continue femara Continue regular self exams 

## 2012-09-01 ENCOUNTER — Encounter (INDEPENDENT_AMBULATORY_CARE_PROVIDER_SITE_OTHER): Payer: Self-pay | Admitting: General Surgery

## 2012-09-01 NOTE — Progress Notes (Signed)
Subjective:     Patient ID: Savannah Jones, female   DOB: 07/21/1945, 67 y.o.   MRN: [id number]  HPI The patient is a 67 year old white female who is 2 years status post right mastectomy and negative sentinel node biopsy for a T1 C. N0 right breast cancer. She continues to take Femara and tolerates it well. Her main complaint is of joint pain in the knees. She denies any chest wall pain. Otherwise her appetite is good and her bowels are working normally. I believe she is due for her next screening mammogram.  Review of Systems  Constitutional: Negative.   HENT: Negative.   Eyes: Negative.   Respiratory: Negative.   Cardiovascular: Negative.   Gastrointestinal: Negative.   Endocrine: Negative.   Genitourinary: Negative.   Musculoskeletal: Negative.   Skin: Negative.   Allergic/Immunologic: Negative.   Neurological: Negative.   Hematological: Negative.   Psychiatric/Behavioral: Negative.        Objective:   Physical Exam  Constitutional: She is oriented to person, place, and time. She appears well-developed and well-nourished.  HENT:  Head: Normocephalic and atraumatic.  Eyes: Conjunctivae and EOM are normal. Pupils are equal, round, and reactive to light.  Neck: Normal range of motion. Neck supple.  Cardiovascular: Normal rate, regular rhythm and normal heart sounds.   Pulmonary/Chest: Effort normal and breath sounds normal.  There is no palpable mass of the right chest wall. There is no palpable mass of the left breast. There is no palpable axillary or supraclavicular cervical lymphadenopathy  Abdominal: Soft. Bowel sounds are normal. She exhibits no mass. There is no tenderness.  Musculoskeletal: Normal range of motion.  Lymphadenopathy:    She has no cervical adenopathy.  Neurological: She is alert and oriented to person, place, and time.  Skin: Skin is warm and dry.  Psychiatric: She has a normal mood and affect. Her behavior is normal.       Assessment:     The  patient is to have years status post right mastectomy for breast cancer     Plan:     At this point she will continue to take Femara. She will continue to do regular self exams. We will plan to see her back in about 6 months.

## 2012-09-27 ENCOUNTER — Ambulatory Visit: Payer: Medicare Other

## 2012-10-05 ENCOUNTER — Ambulatory Visit
Admission: RE | Admit: 2012-10-05 | Discharge: 2012-10-05 | Disposition: A | Discharge status: Home / Self Care | Payer: Medicare Other | Source: Ambulatory Visit

## 2012-10-05 DIAGNOSIS — Z9011 Acquired absence of right breast and nipple: Secondary | ICD-10-CM

## 2012-10-05 DIAGNOSIS — Z1231 Encounter for screening mammogram for malignant neoplasm of breast: Secondary | ICD-10-CM

## 2012-12-06 ENCOUNTER — Other Ambulatory Visit: Payer: Self-pay | Admitting: Obstetrics and Gynecology

## 2012-12-07 ENCOUNTER — Telehealth: Payer: Self-pay | Admitting: Oncology

## 2012-12-07 ENCOUNTER — Other Ambulatory Visit (HOSPITAL_BASED_OUTPATIENT_CLINIC_OR_DEPARTMENT_OTHER): Payer: Medicare Other | Admitting: Lab

## 2012-12-07 ENCOUNTER — Ambulatory Visit (HOSPITAL_BASED_OUTPATIENT_CLINIC_OR_DEPARTMENT_OTHER): Payer: Medicare Other | Admitting: Physician Assistant

## 2012-12-07 ENCOUNTER — Encounter: Payer: Self-pay | Admitting: Physician Assistant

## 2012-12-07 VITALS — BP 138/85 | HR 83 | Temp 98.2°F | Resp 18 | Ht 64.0 in | Wt 209.3 lb

## 2012-12-07 DIAGNOSIS — Z780 Asymptomatic menopausal state: Secondary | ICD-10-CM

## 2012-12-07 DIAGNOSIS — C50919 Malignant neoplasm of unspecified site of unspecified female breast: Secondary | ICD-10-CM

## 2012-12-07 DIAGNOSIS — C50911 Malignant neoplasm of unspecified site of right female breast: Secondary | ICD-10-CM

## 2012-12-07 LAB — CBC WITH DIFFERENTIAL/PLATELET
BASO%: 1.3 % (ref 0.0–2.0)
Basophils Absolute: 0.1 10*3/uL (ref 0.0–0.1)
EOS%: 2.1 % (ref 0.0–7.0)
HGB: 13.8 g/dL (ref 11.6–15.9)
MCH: 30.1 pg (ref 25.1–34.0)
MCHC: 33.7 g/dL (ref 31.5–36.0)
MCV: 89.3 fL (ref 79.5–101.0)
MONO%: 7.3 % (ref 0.0–14.0)
RDW: 13.2 % (ref 11.2–14.5)
lymph#: 2.4 10*3/uL (ref 0.9–3.3)

## 2012-12-07 LAB — COMPREHENSIVE METABOLIC PANEL (CC13)
ALT: 18 U/L (ref 0–55)
AST: 20 U/L (ref 5–34)
Albumin: 3.9 g/dL (ref 3.5–5.0)
Alkaline Phosphatase: 89 U/L (ref 40–150)
BUN: 12.5 mg/dL (ref 7.0–26.0)
Creatinine: 0.8 mg/dL (ref 0.6–1.1)
Potassium: 3.6 mEq/L (ref 3.5–5.1)

## 2012-12-07 MED ORDER — LETROZOLE 2.5 MG PO TABS: 2.5000 mg | ORAL_TABLET | Freq: Every day | ORAL | Status: DC

## 2012-12-07 NOTE — Progress Notes (Signed)
ID: Savannah Jones   DOB: February 15, 1946  MR#: [id number]  CSN#:[id number]   PCP:  Lupita Raider, MD GYN:  Marcelle Overlie, MD SU:  Chevis Pretty, MD Other:  Bernette Redbird, MD    HISTORY OF PRESENT ILLNESS: The patient had an abnormal screening mammogram in April 2011 and she was referred to [facility] by Dr. Vincente Poli for further evaluation.  On May 3, she had right diagnostic mammography and right breast ultrasound by Dr. Kearney Hard and additional views showed a well-defined density in the right upper outer quadrant which was not palpable.  Ultrasound showed mixed echogenicity area measuring approximately 1.6 cm, correlating well with the mammogram.  There were no cysts and ultrasound of the right axilla was unremarkable.  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  With this information the patient was referred to Dr. Carolynne Edouard and on Sep 20, 2009, bilateral breast MRIs were obtained.  In the right breast, there was an irregular spiculated mass measuring 3.2 cm which was the biopsy proven cancer. There was a separate 1.6 cm area of mass like enhancement more anterior to the prior mass, but without a bridging area of enhancement.  If both masses are taking the intervening non-enhancing parenchyma, the total measurement was 7.5 cm.  The left breast was unremarkable.  Accordingly, the patient was recalled for repeat right ultrasonography on May 13, but the second mass noted by MRI could not be located by ultrasound.  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  E-cadherin stain shows  weak membranous staining.  With this information, the patient is referred for further evaluation and treatment.  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  The two areas in question accordingly may be unrelated.  INTERVAL HISTORY: Savannah Jones returns today with her husband, Savannah Jones, for followup of her right breast cancer. She is staying busy. She is working out at least twice weekly through the [employer] which she is enjoying. Her biggest complaint continues to be diffuse joint pain. She admits that this was a problem prior to letrozole. It does seem to have worsened, but after taking a break from the letrozole between October of 2012 and may 2013, she saw no significant improvement in the joint pain and resumed the medication.   REVIEW OF SYSTEMS: Savannah Jones has had no recent illnesses and denies any fevers or chills. She has no problems with hot flashes and denies any abnormal vaginal bleeding or vaginal dryness. Her energy level is fair. Her appetite is good and she denies any nausea or change in bowel or bladder habits. She's had no cough, phlegm production, or chest pain. She does have some mild shortness of breath with exertion alone. She's had no abnormal headaches or dizziness. She denies any peripheral swelling.  A detailed review of systems today was otherwise noncontributory.  PAST MEDICAL HISTORY: Past Medical History  Diagnosis Date  . Hyperlipidemia   . IBS (irritable bowel syndrome)   . Arthritis   Significant for  osteoarthritis, hypertension, history of interstitial cystitis treated most recently by Dr. Eudelia Bunch, but not requiring any intervention currently.  She did have some irrigation treatments in the past and she has chronic microscopic hematuria as a result of that.  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  She is status post tonsillectomy, status post laparoscopic "surgery" looking for endometriosis to try to explain her difficulty getting pregnant, but she did not have endometriosis.  She has hypercholesterolemia, GERD which she describes as mild, history of irritable bowel syndrome associated with stress, history of squamous cell skin cancers removed by Dr. Yetta Barre.  She has a history of tobacco abuse of a little less than 10 pack years.  PAST SURGICAL HISTORY: Past Surgical History  Procedure Laterality Date  . Mastectomy  03/05/10    negative sentinel bx, T1c no right breast cancer  . Breast surgery      FAMILY HISTORY The patient's father died at the age of 3 from a subarachnoid hemorrhage.  The patient's mother died at the age of 28 from complications of diabetes.  The patient is an only child.  GYNECOLOGIC HISTORY: She is Gx, P2, first pregnancy to term age 62.  She went through the change of life around age 30 and she never took hormones.  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  Her husband Savannah Jones, is a retired Chartered loss adjuster.  He used to Regulatory affairs officer.  Son Savannah Jones works for [employer] in [place].  Daughter Savannah Jones  plans to be a Copy.  The patient has 1 grandson. She attends a [employer].   ADVANCED DIRECTIVES:  HEALTH MAINTENANCE: History  Substance Use Topics  . Smoking status: Former Games developer  . Smokeless tobacco: Former Neurosurgeon    Quit date: 02/26/1974  . Alcohol Use: No     Colonoscopy:  May 2014, Dr. Matthias Hughs  PAP:  July 2014, Dr. Vincente Poli  Bone density: 02/05/2011/[facility]/normal  Lipid panel:  Dr. Lupita Raider  Allergies  Allergen Reactions  . Sulfa Antibiotics     Obtain reaction/severity from patient.    Current Outpatient Prescriptions  Medication Sig Dispense Refill  . amLODipine  (NORVASC) 10 MG tablet Take 10 mg by mouth daily.        Marland Kitchen letrozole (FEMARA) 2.5 MG tablet Take 1 tablet (2.5 mg total) by mouth daily.  30 tablet  12  . lovastatin (MEVACOR) 40 MG tablet       . ramipril (ALTACE) 10 MG capsule Take 10 mg by mouth daily.         No current facility-administered medications for this visit.    OBJECTIVE: middle-aged white woman in no acute distress Filed Vitals:   12/07/12 1319  BP: 138/85  Pulse: 83  Temp: 98.2 F (36.8 C)  Resp: 18     Body mass index is 35.91 kg/(m^2).    ECOG FS: 0 Filed Weights   12/07/12 1319  Weight: 209 lb 4.8 oz (94.938 kg)    Sclerae unicteric Oropharynx clear No cervical or supraclavicular adenopathy Lungs clear to auscultation bilaterally, no rales or rhonchi Heart regular rate and rhythm, no murmur appreciated Abd obese, soft, nontender to palpation, positive bowel sounds MSK no focal spinal tenderness, no peripheral edema Neuro: nonfocal, well oriented, appropriate affect Breasts: the right breast  is status postmastectomy. There is no evidence of local recurrence.  Left breast is unremarkable. Axillae are benign bilaterally with no palpable adenopathy noted.    LAB RESULTS: Lab Results  Component Value Date   WBC 7.5 12/07/2012   NEUTROABS 4.2 12/07/2012   HGB 13.8 12/07/2012   HCT 40.9 12/07/2012   MCV 89.3 12/07/2012   PLT 314 12/07/2012      Chemistry      Component Value Date/Time   NA 142 12/07/2012 1247   NA 141 12/04/2011 1112   K 3.6 12/07/2012 1247   K 4.4 12/04/2011 1112   CL 100 06/08/2012 1101   CL 103 12/04/2011 1112   CO2 25 12/07/2012 1247   CO2 30 12/04/2011 1112   BUN 12.5 12/07/2012 1247   BUN 13 12/04/2011 1112   CREATININE 0.8 12/07/2012 1247   CREATININE 0.85 12/04/2011 1112      Component Value Date/Time   CALCIUM 9.4 12/07/2012 1247   CALCIUM 9.6 12/04/2011 1112   ALKPHOS 89 12/07/2012 1247   ALKPHOS 76 12/04/2011 1112   AST 20 12/07/2012 1247   AST 18 12/04/2011 1112   ALT 18 12/07/2012  1247   ALT 17 12/04/2011 1112   BILITOT 0.46 12/07/2012 1247   BILITOT 0.4 12/04/2011 1112        STUDIES:  Most recent left mammogram on 10/06/2012 was unremarkable.   She had a normal bone density at [facility] in September 2012.  ASSESSMENT: 67 y.o.  [place] woman   (1) status post right breast biopsy May of 2011 for an invasive ductal carcinoma, estrogen receptor positive at 100%, but progesterone receptor negative at 0%, with a low proliferation fraction at 11% and an equivocal result by C[facility] for HER2/neu amplification, with a ratio of 2.04.  (2)  treated neoadjuvantly with 5 cycles of docetaxel/ carboplatin/ trastuzumab, completed October of 2011, the 6th cycle held because of neuropathy,   (3) status post right mastectomy and sentinel lymph node sampling October of 2011 for what proved to be a ypT1c ypN0, strongly ER positive, weakly PR positive, clearly HER2 negative invasive ductal carcinoma with a low proliferation fraction.  She did not require postmastectomy radiation.   (4) on letrozole January to October of 2012, discontinued because of arthralgias, definitively resumed May 2013  PLAN: Savannah Jones appears to be doing very well with regards to her breast cancer, and will continue on letrozole which I have refilled for her today. I encouraged her to continue exercising regularly. We also discussed the possibility of seeing either a rheumatologist or an orthopedist for further evaluation of her joint pain. Once again, I will mention the fact that in the past, after going off of the letrozole for 5-6 months, she saw no significant decrease in her joint pain, so I am not convinced that is the medication that is causing the problem.  Savannah Jones is due for her next bone density at [facility] in September. She will see Dr. Carolynne Edouard again in October, and we will see her for routine followup in January. Her next mammogram will be due in May of next year. All this was reviewed in detail with  Savannah Jones. She and her husband both voice understanding and agreement with this plan, and will call any changes prior to her next appointment.   Savannah Jones    12/07/2012

## 2012-12-07 NOTE — Telephone Encounter (Signed)
gv pt appt schedule for January 2015. S/w Carol @ [facility] [phone] - Dr. Clelia Croft) re bone density test. Per Okey Regal message sent to Dr. Clelia Croft and she will call me back w/appt. Pt aware.

## 2012-12-10 ENCOUNTER — Telehealth: Payer: Self-pay | Admitting: Oncology

## 2012-12-10 NOTE — Telephone Encounter (Signed)
Amy from [facility] [phone] - Dr. Clelia Croft) called back re bone density test and per Amy they will schedule appt and contact pt. Pt given other appts prior to leaving 7/29.

## 2013-01-19 ENCOUNTER — Encounter (INDEPENDENT_AMBULATORY_CARE_PROVIDER_SITE_OTHER): Payer: Self-pay | Admitting: General Surgery

## 2013-03-01 ENCOUNTER — Ambulatory Visit (INDEPENDENT_AMBULATORY_CARE_PROVIDER_SITE_OTHER): Payer: Medicare Other | Admitting: General Surgery

## 2013-03-01 ENCOUNTER — Encounter (INDEPENDENT_AMBULATORY_CARE_PROVIDER_SITE_OTHER): Payer: Self-pay

## 2013-03-01 ENCOUNTER — Encounter (INDEPENDENT_AMBULATORY_CARE_PROVIDER_SITE_OTHER): Payer: Self-pay | Admitting: General Surgery

## 2013-03-01 VITALS — BP 128/72 | HR 68 | Temp 97.4°F | Resp 14 | Ht 64.5 in | Wt 210.2 lb

## 2013-03-01 DIAGNOSIS — C50911 Malignant neoplasm of unspecified site of right female breast: Secondary | ICD-10-CM

## 2013-03-01 DIAGNOSIS — C50919 Malignant neoplasm of unspecified site of unspecified female breast: Secondary | ICD-10-CM

## 2013-03-01 NOTE — Patient Instructions (Signed)
Continue letrozole Continue regular self exams 

## 2013-03-02 NOTE — Progress Notes (Signed)
Subjective:     Patient ID: Savannah Jones, female   DOB: December 02, 1945, 67 y.o.   MRN: [id number]  HPI The patient is a 67 year old white female who is 2-1/2 years status post right mastectomy and negative sentinel node biopsy for a T1 C. N0 right breast cancer. She continues to take Femara and tolerates it well. She does have pain in her knees hips and feet. She also occasionally gets shooting pains in the right chest wall. Otherwise she has had no major medical problems since her last visit.  Review of Systems  Constitutional: Negative.   HENT: Negative.   Eyes: Negative.   Respiratory: Negative.   Cardiovascular: Negative.   Gastrointestinal: Negative.   Endocrine: Negative.   Genitourinary: Negative.   Musculoskeletal: Negative.   Skin: Negative.   Allergic/Immunologic: Negative.   Neurological: Negative.   Hematological: Negative.   Psychiatric/Behavioral: Negative.        Objective:   Physical Exam  Constitutional: She is oriented to person, place, and time. She appears well-developed and well-nourished.  HENT:  Head: Normocephalic and atraumatic.  Eyes: Conjunctivae and EOM are normal. Pupils are equal, round, and reactive to light.  Neck: Normal range of motion. Neck supple.  Cardiovascular: Normal rate, regular rhythm and normal heart sounds.   Pulmonary/Chest: Effort normal and breath sounds normal.  There is no palpable mass of the right chest wall. There is no palpable mass of the left breast. There is no palpable axillary, supraclavicular, or cervical lymphadenopathy  Abdominal: Soft. Bowel sounds are normal.  Musculoskeletal: Normal range of motion.  Neurological: She is alert and oriented to person, place, and time.  Skin: Skin is warm and dry.  Psychiatric: She has a normal mood and affect. Her behavior is normal.       Assessment:     The patient is to have year status post right mastectomy for breast cancer     Plan:     At this point she will  continue to do regular self exams. She will continue to take Femara. We will plan to see her back in about 6 months

## 2013-04-22 ENCOUNTER — Telehealth: Payer: Self-pay | Admitting: *Deleted

## 2013-04-22 NOTE — Telephone Encounter (Signed)
Lm made the pt aware that GCM will be on call 06/09/13. gv appt for 06/13/13 w/labs@ 3pm and ov@ 3:30pm. Pt is aware that i will mail a letter/avs...td

## 2013-06-02 ENCOUNTER — Other Ambulatory Visit: Payer: Medicare Other

## 2013-06-09 ENCOUNTER — Ambulatory Visit: Payer: Medicare Other | Admitting: Oncology

## 2013-06-13 ENCOUNTER — Telehealth: Payer: Self-pay | Admitting: Oncology

## 2013-06-13 ENCOUNTER — Other Ambulatory Visit (HOSPITAL_BASED_OUTPATIENT_CLINIC_OR_DEPARTMENT_OTHER): Payer: Medicare Other

## 2013-06-13 ENCOUNTER — Ambulatory Visit (HOSPITAL_BASED_OUTPATIENT_CLINIC_OR_DEPARTMENT_OTHER): Payer: Medicare Other | Admitting: Oncology

## 2013-06-13 VITALS — BP 136/81 | HR 91 | Temp 99.3°F | Resp 18 | Ht 64.5 in | Wt 204.7 lb

## 2013-06-13 DIAGNOSIS — C50911 Malignant neoplasm of unspecified site of right female breast: Secondary | ICD-10-CM

## 2013-06-13 DIAGNOSIS — M1710 Unilateral primary osteoarthritis, unspecified knee: Secondary | ICD-10-CM

## 2013-06-13 DIAGNOSIS — C50419 Malignant neoplasm of upper-outer quadrant of unspecified female breast: Secondary | ICD-10-CM

## 2013-06-13 DIAGNOSIS — Z170 Estrogen receptor positive status [ER+]: Secondary | ICD-10-CM

## 2013-06-13 DIAGNOSIS — C50411 Malignant neoplasm of upper-outer quadrant of right female breast: Secondary | ICD-10-CM | POA: Insufficient documentation

## 2013-06-13 DIAGNOSIS — IMO0002 Reserved for concepts with insufficient information to code with codable children: Secondary | ICD-10-CM

## 2013-06-13 LAB — CBC WITH DIFFERENTIAL/PLATELET
BASO%: 0.7 % (ref 0.0–2.0)
BASOS ABS: 0.1 10*3/uL (ref 0.0–0.1)
EOS ABS: 0.2 10*3/uL (ref 0.0–0.5)
EOS%: 1.6 % (ref 0.0–7.0)
HCT: 41.4 % (ref 34.8–46.6)
HEMOGLOBIN: 13.8 g/dL (ref 11.6–15.9)
LYMPH#: 2.8 10*3/uL (ref 0.9–3.3)
LYMPH%: 25.2 % (ref 14.0–49.7)
MCH: 30.4 pg (ref 25.1–34.0)
MCHC: 33.2 g/dL (ref 31.5–36.0)
MCV: 91.7 fL (ref 79.5–101.0)
MONO#: 0.7 10*3/uL (ref 0.1–0.9)
MONO%: 6.4 % (ref 0.0–14.0)
NEUT%: 66.1 % (ref 38.4–76.8)
NEUTROS ABS: 7.2 10*3/uL — AB (ref 1.5–6.5)
Platelets: 307 10*3/uL (ref 145–400)
RBC: 4.52 10*6/uL (ref 3.70–5.45)
RDW: 13.5 % (ref 11.2–14.5)
WBC: 10.9 10*3/uL — AB (ref 3.9–10.3)

## 2013-06-13 LAB — COMPREHENSIVE METABOLIC PANEL (CC13)
ALBUMIN: 4.2 g/dL (ref 3.5–5.0)
ALT: 19 U/L (ref 0–55)
AST: 17 U/L (ref 5–34)
Alkaline Phosphatase: 92 U/L (ref 40–150)
Anion Gap: 11 mEq/L (ref 3–11)
BUN: 16.7 mg/dL (ref 7.0–26.0)
CALCIUM: 9.8 mg/dL (ref 8.4–10.4)
CHLORIDE: 103 meq/L (ref 98–109)
CO2: 26 meq/L (ref 22–29)
Creatinine: 0.8 mg/dL (ref 0.6–1.1)
GLUCOSE: 88 mg/dL (ref 70–140)
POTASSIUM: 3.7 meq/L (ref 3.5–5.1)
Sodium: 140 mEq/L (ref 136–145)
TOTAL PROTEIN: 7.5 g/dL (ref 6.4–8.3)
Total Bilirubin: 0.42 mg/dL (ref 0.20–1.20)

## 2013-06-13 NOTE — Progress Notes (Signed)
ID: Savannah Jones   DOB: [birth year]  MR#: [id number]  KAJ#:[id number]   PCP:  Mayra Neer, MD GYN:  Dian Queen, MD SU:  Autumn Messing, MD Other:  Ronald Lobo, MD    HISTORY OF PRESENT ILLNESS: The patient had an abnormal screening mammogram in April 2011 and she was referred to [facility] by Dr. Helane Rima for further evaluation.  On May 3, she had right diagnostic mammography and right breast ultrasound by Dr. Ardeen Garland and additional views showed a well-defined density in the right upper outer quadrant which was not palpable.  Ultrasound showed mixed echogenicity area measuring approximately 1.6 cm, correlating well with the mammogram.  There were no cysts and ultrasound of the right axilla was unremarkable.  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  With this information the patient was referred to Dr. Marlou Starks and on Sep 20, 2009, bilateral breast MRIs were obtained.  In the right breast, there was an irregular spiculated mass measuring 3.2 cm which was the biopsy proven cancer. There was a separate 1.6 cm area of mass like enhancement more anterior to the prior mass, but without a bridging area of enhancement.  If both masses are taking the intervening non-enhancing parenchyma, the total measurement was 7.5 cm.  The left breast was unremarkable.  Accordingly, the patient was recalled for repeat right ultrasonography on May 13, but the second mass noted by MRI could not be located by ultrasound.  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  E-cadherin stain shows  weak membranous staining.  With this information, the patient is referred for further evaluation and treatment.  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  The two areas in question accordingly may be unrelated.  INTERVAL HISTORY: Savannah Jones returns today with her husband, Joneen Boers, for followup of her right breast cancer. The holidays were fine except that everybody was sick with the flu. She still had a good time.   REVIEW OF SYSTEMS: Savannah Jones is tolerating the anastrozole with no hot flashes or vaginal dryness issues. Her big problem is her knees, which hurt. This keeps her from exercising, which causes her to gain weight, which means her knees hurt more. Reviewing her weight though she has lost 10 pounds as compared to 3 years ago. That is favorable. She does May she had an episode of abdominal pain and saw Dr. Cristina Gong regarding that. The problem has "gone away" although she still does not know what caused it. She tried the [employer] but got sick in the middle of it and could never finished. Otherwise detailed review of systems today was noncontributory  PAST MEDICAL HISTORY: Past Medical History  Diagnosis Date  . Hyperlipidemia   . IBS (irritable bowel syndrome)   . Arthritis   Significant for osteoarthritis, hypertension, history of interstitial cystitis treated most recently by Dr. Lawrence Santiago, but not requiring any intervention currently.  She did have some irrigation treatments in the past and she has chronic microscopic hematuria  as a result of that.  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  She is status post tonsillectomy, status post laparoscopic "surgery" looking for endometriosis to try to  explain her difficulty getting pregnant, but she did not have endometriosis.  She has hypercholesterolemia, GERD which she describes as mild, history of irritable bowel syndrome associated with stress, history of squamous cell skin cancers removed by Dr. Ronnald Ramp.  She has a history of tobacco abuse of a little less than 10 pack years.  PAST SURGICAL HISTORY: Past Surgical History  Procedure Laterality Date  . Mastectomy  03/05/10    negative sentinel bx, T1c no right breast cancer  . Breast surgery      FAMILY HISTORY The patient's father died at the age of 70 from a subarachnoid hemorrhage.  The patient's mother died at the age of 41 from complications of diabetes.  The patient is an only child.  GYNECOLOGIC HISTORY: She is Gx, P2, first pregnancy to term age 57.  She went through the change of life around age 15 and she never took hormones.  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  Her husband Joneen Boers, is a retired Radio producer.  He used to Chief of Staff.  Son [name] works for [employer] in [place].  Daughter [name]  plans to be a Scientist, water quality.  The patient has 1 grandson. She attends a [employer].   ADVANCED DIRECTIVES:  HEALTH MAINTENANCE: History  Substance Use Topics  . Smoking status: Former Research scientist (life sciences)  . Smokeless tobacco: Former Systems developer    Quit date: 02/26/1974  . Alcohol Use: No     Colonoscopy:  May 2014, Dr. Cristina Gong  PAP:  July 2014, Dr. Helane Rima  Bone density: 02/08/2013/[facility]/normal  Lipid panel:  Dr. Mayra Neer  Allergies  Allergen Reactions  . Sulfa Antibiotics     Obtain reaction/severity from patient.    Current Outpatient Prescriptions  Medication Sig Dispense Refill  . amLODipine (NORVASC) 10 MG tablet Take 10 mg by mouth daily.        Marland Kitchen letrozole (FEMARA) 2.5 MG tablet Take 1 tablet (2.5 mg total) by mouth daily.  30 tablet  12  . lovastatin (MEVACOR) 40 MG tablet       . ramipril (ALTACE) 10 MG capsule Take 10  mg by mouth daily.         No current facility-administered medications for this visit.    OBJECTIVE: middle-aged white woman in no acute distress Filed Vitals:   06/13/13 1524  BP: 136/81  Pulse: 91  Temp: 99.3 F (37.4 C)  Resp: 18     Body mass index is 34.61 kg/(m^2).    ECOG FS: 1 Filed Weights   06/13/13 1524  Weight: 204 lb 11.2 oz (92.851 kg)    Sclerae unicteric, R pupil inpoint (stable) Oropharynx clear, teeth in good repair No cervical or supraclavicular adenopathy Lungs clear to auscultation bilaterally, no rales or rhonchi Heart regular rate and rhythm, no murmur appreciated Abd obese, soft, nontender to palpation, positive bowel sounds MSK no focal spinal tenderness, no upper extremity lymphedema Neuro: nonfocal, well oriented, appropriate affect Breasts: the right breast is status postmastectomy. There is no evidence of local recurrence. The right axilla is benign Left breast is unremarkable.  LAB RESULTS: Lab Results  Component  Value Date   WBC 10.9* 06/13/2013   NEUTROABS 7.2* 06/13/2013   HGB 13.8 06/13/2013   HCT 41.4 06/13/2013   MCV 91.7 06/13/2013   PLT 307 06/13/2013      Chemistry      Component Value Date/Time   NA 142 12/07/2012 1247   NA 141 12/04/2011 1112   K 3.6 12/07/2012 1247   K 4.4 12/04/2011 1112   CL 100 06/08/2012 1101   CL 103 12/04/2011 1112   CO2 25 12/07/2012 1247   CO2 30 12/04/2011 1112   BUN 12.5 12/07/2012 1247   BUN 13 12/04/2011 1112   CREATININE 0.8 12/07/2012 1247   CREATININE 0.85 12/04/2011 1112      Component Value Date/Time   CALCIUM 9.4 12/07/2012 1247   CALCIUM 9.6 12/04/2011 1112   ALKPHOS 89 12/07/2012 1247   ALKPHOS 76 12/04/2011 1112   AST 20 12/07/2012 1247   AST 18 12/04/2011 1112   ALT 18 12/07/2012 1247   ALT 17 12/04/2011 1112   BILITOT 0.46 12/07/2012 1247   BILITOT 0.4 12/04/2011 1112        STUDIES:  Most recent left mammogram on 10/06/2012 was unremarkable.   She had a normal bone density at [facility] in  September 2014  ASSESSMENT: 68 y.o.  [place] woman   (1) status post right breast biopsy May of 2011 for an invasive ductal carcinoma, estrogen receptor positive at 100%, but progesterone receptor negative at 0%, with a low proliferation fraction at 11% and an equivocal result by [facility] for HER2/neu amplification, with a ratio of 2.04.  (2)  treated neoadjuvantly with 5 cycles of docetaxel/ carboplatin/ trastuzumab, completed October of 2011, the 6th cycle held because of neuropathy,   (3) status post right mastectomy and sentinel lymph node sampling October of 2011 for what proved to be a ypT1c ypN0, strongly ER positive, weakly PR positive, clearly HER2 negative invasive ductal carcinoma with a low proliferation fraction.  She did not require postmastectomy radiation.   (4) on letrozole January to October of 2012, discontinued because of arthralgias, definitively resumed May 2013. Bone density 02/08/2013 was normal  PLAN: Savannah Jones is doing fine as far as her breast cancer is concerned. In particular, she is tolerating the letrozole without any significant side effects. The plan is going to be to continue that for 4 years, which, taking into account the fact that she had a 6 month hiatus, is to take is to July of 2017.  The pain in her knees is going to be due to arthritis, being overweight and not exercising appropriately. Of course she can't exercise because of the pain so the knees get worse so she exercises less, and so on. Today we spent well over 30 minutes discussing these issues and I strongly advised her to go ahead and join a Y and start cycling, slow walking, and swimming, to see if she can get in good physical shape, bring her weight down, and then perhaps rehabilitate the knees. Otherwise I think she will probably be needing knee replacement in a few years  With her bone density being normal, and her having essentially no symptoms from the anastrozole, I am comfortable seeing her on  a yearly basis. She knows to call for any problems that may develop before her next visit here  Primrose Oler C    06/13/2013

## 2013-06-13 NOTE — Telephone Encounter (Signed)
, °

## 2013-08-03 ENCOUNTER — Telehealth: Payer: Self-pay | Admitting: Oncology

## 2013-08-03 NOTE — Telephone Encounter (Signed)
Faxed pt medical records [email or web address] 

## 2013-08-23 ENCOUNTER — Encounter (INDEPENDENT_AMBULATORY_CARE_PROVIDER_SITE_OTHER): Payer: Self-pay

## 2013-08-29 ENCOUNTER — Other Ambulatory Visit (INDEPENDENT_AMBULATORY_CARE_PROVIDER_SITE_OTHER): Payer: Self-pay | Admitting: *Deleted

## 2013-08-29 ENCOUNTER — Encounter (INDEPENDENT_AMBULATORY_CARE_PROVIDER_SITE_OTHER): Payer: Self-pay | Admitting: General Surgery

## 2013-08-29 ENCOUNTER — Ambulatory Visit (INDEPENDENT_AMBULATORY_CARE_PROVIDER_SITE_OTHER): Payer: Medicare Other | Admitting: General Surgery

## 2013-08-29 VITALS — BP 124/82 | HR 80 | Temp 97.8°F | Resp 14 | Ht 64.0 in | Wt 200.0 lb

## 2013-08-29 DIAGNOSIS — C50911 Malignant neoplasm of unspecified site of right female breast: Secondary | ICD-10-CM

## 2013-08-29 DIAGNOSIS — C50919 Malignant neoplasm of unspecified site of unspecified female breast: Secondary | ICD-10-CM

## 2013-08-29 NOTE — Progress Notes (Signed)
Subjective:     Patient ID: Savannah Jones, female   DOB: 1945/09/04, 68 y.o.   MRN: [id number]  HPI The patient is a 68 year old white female who is 3 years status post right mastectomy and negative sentinel node biopsy for a T1 C. N0 right breast cancer. She continues to take Femara and is tolerating it well. Over the last week or 2 she has noticed some tightness in her right shoulder in the back and some sharp pains that come and go in the front of the right chest. She has otherwise been well.  Review of Systems  Constitutional: Negative.   HENT: Negative.   Eyes: Negative.   Respiratory: Negative.   Cardiovascular: Positive for chest pain.  Gastrointestinal: Negative.   Endocrine: Negative.   Genitourinary: Negative.   Musculoskeletal: Positive for back pain.  Skin: Negative.   Allergic/Immunologic: Negative.   Neurological: Negative.   Hematological: Negative.   Psychiatric/Behavioral: Negative.        Objective:   Physical Exam  Constitutional: She is oriented to person, place, and time. She appears well-developed and well-nourished.  HENT:  Head: Normocephalic and atraumatic.  Eyes: Conjunctivae and EOM are normal. Pupils are equal, round, and reactive to light.  Neck: Normal range of motion. Neck supple.  Cardiovascular: Normal rate, regular rhythm and normal heart sounds.   Pulmonary/Chest: Effort normal and breath sounds normal.  There is some mild general fullness in the upper right chest but no discrete mass. There is no palpable mass in the left breast. There is no palpable axillary, supraclavicular, or cervical lymphadenopathy.  Abdominal: Soft. Bowel sounds are normal.  Musculoskeletal: Normal range of motion.  Lymphadenopathy:    She has no cervical adenopathy.  Neurological: She is alert and oriented to person, place, and time.  Skin: Skin is warm and dry.  Psychiatric: She has a normal mood and affect. Her behavior is normal.       Assessment:     The  patient is 3 years status post right mastectomy for breast cancer     Plan:     At this point she will continue to take Femara. She will continue to do regular self exams. I will obtain an ultrasound of the right chest wall. If this is negative then I will plan to see her back in 6 months. I will also refer her to physical therapy for the right shoulder tightness

## 2013-08-29 NOTE — Addendum Note (Signed)
Addended by: Carlene Coria on: 08/29/2013 10:02 AM   Modules accepted: Orders

## 2013-09-02 ENCOUNTER — Other Ambulatory Visit: Payer: Self-pay

## 2013-09-02 ENCOUNTER — Ambulatory Visit
Admission: RE | Admit: 2013-09-02 | Discharge: 2013-09-02 | Disposition: A | Discharge status: Home / Self Care | Payer: Medicare Other | Source: Ambulatory Visit | Attending: General Surgery | Admitting: General Surgery

## 2013-09-02 DIAGNOSIS — Z1231 Encounter for screening mammogram for malignant neoplasm of breast: Secondary | ICD-10-CM

## 2013-09-02 DIAGNOSIS — C50911 Malignant neoplasm of unspecified site of right female breast: Secondary | ICD-10-CM

## 2013-09-02 DIAGNOSIS — Z9011 Acquired absence of right breast and nipple: Secondary | ICD-10-CM

## 2013-09-05 ENCOUNTER — Ambulatory Visit (INDEPENDENT_AMBULATORY_CARE_PROVIDER_SITE_OTHER): Payer: Medicare Other | Admitting: General Surgery

## 2013-09-22 ENCOUNTER — Ambulatory Visit: Payer: Medicare Other | Attending: General Surgery | Admitting: Physical Therapy

## 2013-09-22 DIAGNOSIS — IMO0001 Reserved for inherently not codable concepts without codable children: Secondary | ICD-10-CM | POA: Insufficient documentation

## 2013-09-22 DIAGNOSIS — I890 Lymphedema, not elsewhere classified: Secondary | ICD-10-CM | POA: Insufficient documentation

## 2013-09-22 DIAGNOSIS — R5381 Other malaise: Secondary | ICD-10-CM | POA: Insufficient documentation

## 2013-09-22 DIAGNOSIS — M24519 Contracture, unspecified shoulder: Secondary | ICD-10-CM | POA: Insufficient documentation

## 2013-09-28 ENCOUNTER — Ambulatory Visit: Payer: Medicare Other

## 2013-09-30 ENCOUNTER — Ambulatory Visit: Payer: Medicare Other

## 2013-10-05 ENCOUNTER — Ambulatory Visit: Payer: Medicare Other | Admitting: Physical Therapy

## 2013-10-06 ENCOUNTER — Ambulatory Visit: Payer: Medicare Other

## 2013-10-10 ENCOUNTER — Ambulatory Visit
Admission: RE | Admit: 2013-10-10 | Discharge: 2013-10-10 | Disposition: A | Discharge status: Home / Self Care | Payer: Medicare Other | Source: Ambulatory Visit

## 2013-10-10 DIAGNOSIS — Z1231 Encounter for screening mammogram for malignant neoplasm of breast: Secondary | ICD-10-CM

## 2013-10-10 DIAGNOSIS — Z9011 Acquired absence of right breast and nipple: Secondary | ICD-10-CM

## 2013-10-11 ENCOUNTER — Ambulatory Visit: Payer: Medicare Other | Attending: General Surgery

## 2013-10-11 DIAGNOSIS — IMO0001 Reserved for inherently not codable concepts without codable children: Secondary | ICD-10-CM | POA: Insufficient documentation

## 2013-10-11 DIAGNOSIS — R5381 Other malaise: Secondary | ICD-10-CM | POA: Insufficient documentation

## 2013-10-11 DIAGNOSIS — I890 Lymphedema, not elsewhere classified: Secondary | ICD-10-CM | POA: Insufficient documentation

## 2013-10-11 DIAGNOSIS — M24519 Contracture, unspecified shoulder: Secondary | ICD-10-CM | POA: Insufficient documentation

## 2013-10-13 ENCOUNTER — Ambulatory Visit: Payer: Medicare Other | Admitting: Physical Therapy

## 2013-10-13 DIAGNOSIS — R5381 Other malaise: Secondary | ICD-10-CM | POA: Diagnosis not present

## 2013-10-13 DIAGNOSIS — M24519 Contracture, unspecified shoulder: Secondary | ICD-10-CM | POA: Diagnosis not present

## 2013-10-13 DIAGNOSIS — IMO0001 Reserved for inherently not codable concepts without codable children: Secondary | ICD-10-CM | POA: Diagnosis present

## 2013-10-13 DIAGNOSIS — I890 Lymphedema, not elsewhere classified: Secondary | ICD-10-CM | POA: Diagnosis not present

## 2013-10-18 ENCOUNTER — Ambulatory Visit: Payer: Medicare Other

## 2013-10-18 DIAGNOSIS — IMO0001 Reserved for inherently not codable concepts without codable children: Secondary | ICD-10-CM | POA: Diagnosis not present

## 2013-10-20 ENCOUNTER — Ambulatory Visit: Payer: Medicare Other | Admitting: Physical Therapy

## 2013-10-20 DIAGNOSIS — IMO0001 Reserved for inherently not codable concepts without codable children: Secondary | ICD-10-CM | POA: Diagnosis not present

## 2013-10-25 ENCOUNTER — Ambulatory Visit: Payer: Medicare Other

## 2013-10-25 DIAGNOSIS — IMO0001 Reserved for inherently not codable concepts without codable children: Secondary | ICD-10-CM | POA: Diagnosis not present

## 2013-10-27 ENCOUNTER — Encounter: Payer: Medicare Other | Admitting: Physical Therapy

## 2013-11-01 ENCOUNTER — Ambulatory Visit: Payer: Medicare Other | Admitting: Physical Therapy

## 2013-11-01 DIAGNOSIS — IMO0001 Reserved for inherently not codable concepts without codable children: Secondary | ICD-10-CM | POA: Diagnosis not present

## 2013-11-03 ENCOUNTER — Ambulatory Visit: Payer: Medicare Other | Admitting: Physical Therapy

## 2013-11-03 DIAGNOSIS — IMO0001 Reserved for inherently not codable concepts without codable children: Secondary | ICD-10-CM | POA: Diagnosis not present

## 2013-11-08 ENCOUNTER — Ambulatory Visit: Payer: Medicare Other | Admitting: Physical Therapy

## 2013-11-08 DIAGNOSIS — IMO0001 Reserved for inherently not codable concepts without codable children: Secondary | ICD-10-CM | POA: Diagnosis not present

## 2013-11-10 ENCOUNTER — Ambulatory Visit: Payer: Medicare Other | Attending: General Surgery | Admitting: Physical Therapy

## 2013-11-10 DIAGNOSIS — M24519 Contracture, unspecified shoulder: Secondary | ICD-10-CM | POA: Diagnosis not present

## 2013-11-10 DIAGNOSIS — IMO0001 Reserved for inherently not codable concepts without codable children: Secondary | ICD-10-CM | POA: Insufficient documentation

## 2013-11-10 DIAGNOSIS — R5381 Other malaise: Secondary | ICD-10-CM | POA: Insufficient documentation

## 2013-11-10 DIAGNOSIS — I890 Lymphedema, not elsewhere classified: Secondary | ICD-10-CM | POA: Diagnosis not present

## 2013-11-15 ENCOUNTER — Ambulatory Visit: Payer: Medicare Other | Admitting: Physical Therapy

## 2013-11-15 DIAGNOSIS — IMO0001 Reserved for inherently not codable concepts without codable children: Secondary | ICD-10-CM | POA: Diagnosis not present

## 2013-11-17 ENCOUNTER — Ambulatory Visit: Payer: Medicare Other | Admitting: Physical Therapy

## 2013-11-17 DIAGNOSIS — IMO0001 Reserved for inherently not codable concepts without codable children: Secondary | ICD-10-CM | POA: Diagnosis not present

## 2013-11-22 ENCOUNTER — Ambulatory Visit: Payer: Medicare Other

## 2013-11-22 DIAGNOSIS — IMO0001 Reserved for inherently not codable concepts without codable children: Secondary | ICD-10-CM | POA: Diagnosis not present

## 2013-11-25 ENCOUNTER — Ambulatory Visit: Payer: Medicare Other | Admitting: Physical Therapy

## 2013-11-25 DIAGNOSIS — IMO0001 Reserved for inherently not codable concepts without codable children: Secondary | ICD-10-CM | POA: Diagnosis not present

## 2013-11-29 ENCOUNTER — Ambulatory Visit: Payer: Medicare Other | Admitting: Physical Therapy

## 2013-11-29 DIAGNOSIS — IMO0001 Reserved for inherently not codable concepts without codable children: Secondary | ICD-10-CM | POA: Diagnosis not present

## 2013-12-01 ENCOUNTER — Ambulatory Visit: Payer: Medicare Other | Admitting: Physical Therapy

## 2013-12-01 DIAGNOSIS — IMO0001 Reserved for inherently not codable concepts without codable children: Secondary | ICD-10-CM | POA: Diagnosis not present

## 2013-12-06 ENCOUNTER — Ambulatory Visit: Payer: Medicare Other | Admitting: Physical Therapy

## 2013-12-06 DIAGNOSIS — IMO0001 Reserved for inherently not codable concepts without codable children: Secondary | ICD-10-CM | POA: Diagnosis not present

## 2013-12-08 ENCOUNTER — Ambulatory Visit: Payer: Medicare Other | Admitting: Physical Therapy

## 2013-12-09 ENCOUNTER — Ambulatory Visit: Payer: Medicare Other | Admitting: Physical Therapy

## 2013-12-09 DIAGNOSIS — IMO0001 Reserved for inherently not codable concepts without codable children: Secondary | ICD-10-CM | POA: Diagnosis not present

## 2013-12-13 ENCOUNTER — Ambulatory Visit: Payer: Medicare Other | Attending: General Surgery | Admitting: Physical Therapy

## 2013-12-13 DIAGNOSIS — R5381 Other malaise: Secondary | ICD-10-CM | POA: Insufficient documentation

## 2013-12-13 DIAGNOSIS — I890 Lymphedema, not elsewhere classified: Secondary | ICD-10-CM | POA: Insufficient documentation

## 2013-12-13 DIAGNOSIS — IMO0001 Reserved for inherently not codable concepts without codable children: Secondary | ICD-10-CM | POA: Diagnosis not present

## 2013-12-13 DIAGNOSIS — M24519 Contracture, unspecified shoulder: Secondary | ICD-10-CM | POA: Insufficient documentation

## 2013-12-15 ENCOUNTER — Ambulatory Visit: Payer: Medicare Other | Admitting: Physical Therapy

## 2013-12-15 DIAGNOSIS — IMO0001 Reserved for inherently not codable concepts without codable children: Secondary | ICD-10-CM | POA: Diagnosis not present

## 2013-12-20 ENCOUNTER — Ambulatory Visit: Payer: Medicare Other | Admitting: Physical Therapy

## 2013-12-22 ENCOUNTER — Ambulatory Visit: Payer: Medicare Other | Admitting: Physical Therapy

## 2013-12-22 DIAGNOSIS — IMO0001 Reserved for inherently not codable concepts without codable children: Secondary | ICD-10-CM | POA: Diagnosis not present

## 2013-12-27 ENCOUNTER — Ambulatory Visit: Payer: Medicare Other | Admitting: Physical Therapy

## 2013-12-27 DIAGNOSIS — IMO0001 Reserved for inherently not codable concepts without codable children: Secondary | ICD-10-CM | POA: Diagnosis not present

## 2013-12-30 ENCOUNTER — Ambulatory Visit: Payer: Medicare Other | Admitting: Physical Therapy

## 2013-12-30 DIAGNOSIS — IMO0001 Reserved for inherently not codable concepts without codable children: Secondary | ICD-10-CM | POA: Diagnosis not present

## 2014-01-02 ENCOUNTER — Ambulatory Visit: Payer: Medicare Other | Admitting: Physical Therapy

## 2014-01-05 ENCOUNTER — Encounter: Payer: Medicare Other | Admitting: Physical Therapy

## 2014-01-09 ENCOUNTER — Encounter: Payer: Medicare Other | Admitting: Physical Therapy

## 2014-01-12 ENCOUNTER — Encounter: Payer: Medicare Other | Admitting: Physical Therapy

## 2014-03-06 ENCOUNTER — Other Ambulatory Visit: Payer: Self-pay | Admitting: Oncology

## 2014-05-11 ENCOUNTER — Telehealth: Payer: Self-pay | Admitting: Oncology

## 2014-05-11 NOTE — Telephone Encounter (Signed)
, °

## 2014-06-12 ENCOUNTER — Other Ambulatory Visit: Payer: Medicare Other

## 2014-06-19 ENCOUNTER — Ambulatory Visit: Payer: Medicare Other | Admitting: Oncology

## 2014-07-11 ENCOUNTER — Other Ambulatory Visit: Payer: Self-pay | Admitting: *Deleted

## 2014-07-11 DIAGNOSIS — C50911 Malignant neoplasm of unspecified site of right female breast: Secondary | ICD-10-CM

## 2014-07-12 ENCOUNTER — Ambulatory Visit (HOSPITAL_BASED_OUTPATIENT_CLINIC_OR_DEPARTMENT_OTHER): Payer: Medicare Other | Admitting: Oncology

## 2014-07-12 ENCOUNTER — Telehealth: Payer: Self-pay | Admitting: Oncology

## 2014-07-12 ENCOUNTER — Other Ambulatory Visit (HOSPITAL_BASED_OUTPATIENT_CLINIC_OR_DEPARTMENT_OTHER): Payer: Medicare Other

## 2014-07-12 VITALS — BP 165/73 | HR 76 | Temp 98.3°F | Resp 18 | Ht 64.0 in | Wt 193.8 lb

## 2014-07-12 DIAGNOSIS — C50911 Malignant neoplasm of unspecified site of right female breast: Secondary | ICD-10-CM

## 2014-07-12 DIAGNOSIS — C50419 Malignant neoplasm of upper-outer quadrant of unspecified female breast: Secondary | ICD-10-CM

## 2014-07-12 DIAGNOSIS — Z853 Personal history of malignant neoplasm of breast: Secondary | ICD-10-CM

## 2014-07-12 LAB — CBC WITH DIFFERENTIAL/PLATELET
BASO%: 1.2 % (ref 0.0–2.0)
Basophils Absolute: 0.1 10*3/uL (ref 0.0–0.1)
EOS ABS: 0.1 10*3/uL (ref 0.0–0.5)
EOS%: 1.6 % (ref 0.0–7.0)
HCT: 43.4 % (ref 34.8–46.6)
HGB: 13.9 g/dL (ref 11.6–15.9)
LYMPH%: 28.3 % (ref 14.0–49.7)
MCH: 29.6 pg (ref 25.1–34.0)
MCHC: 31.9 g/dL (ref 31.5–36.0)
MCV: 92.7 fL (ref 79.5–101.0)
MONO#: 0.5 10*3/uL (ref 0.1–0.9)
MONO%: 6.8 % (ref 0.0–14.0)
NEUT%: 62.1 % (ref 38.4–76.8)
NEUTROS ABS: 4.6 10*3/uL (ref 1.5–6.5)
Platelets: 266 10*3/uL (ref 145–400)
RBC: 4.69 10*6/uL (ref 3.70–5.45)
RDW: 13.6 % (ref 11.2–14.5)
WBC: 7.4 10*3/uL (ref 3.9–10.3)
lymph#: 2.1 10*3/uL (ref 0.9–3.3)

## 2014-07-12 LAB — COMPREHENSIVE METABOLIC PANEL (CC13)
ALK PHOS: 97 U/L (ref 40–150)
ALT: 14 U/L (ref 0–55)
AST: 18 U/L (ref 5–34)
Albumin: 4 g/dL (ref 3.5–5.0)
Anion Gap: 11 mEq/L (ref 3–11)
BUN: 12.3 mg/dL (ref 7.0–26.0)
CHLORIDE: 105 meq/L (ref 98–109)
CO2: 26 meq/L (ref 22–29)
Calcium: 9.5 mg/dL (ref 8.4–10.4)
Creatinine: 0.9 mg/dL (ref 0.6–1.1)
EGFR: 62 mL/min/{1.73_m2} — ABNORMAL LOW (ref >90)
Glucose: 91 mg/dl (ref 70–140)
POTASSIUM: 3.9 meq/L (ref 3.5–5.1)
Sodium: 141 mEq/L (ref 136–145)
TOTAL PROTEIN: 7.2 g/dL (ref 6.4–8.3)
Total Bilirubin: 0.53 mg/dL (ref 0.20–1.20)

## 2014-07-12 NOTE — Progress Notes (Signed)
Savannah Jones   DOB: [birth year]  MR#: [id number]  GSU#:[id number]   PCP:  Savannah Neer, MD GYN:  Savannah Queen, MD SU:  Savannah Messing, MD Other:  Savannah Lobo, MD, Savannah Arabian MD  CHIEF COMPLAINT: Estrogen receptor positive breast cancer  CURRENT TREATMENT: Letrozole  HISTORY OF PRESENT ILLNESS: From the original intake note:  The patient had an abnormal screening mammogram in April 2011 and she was referred to [facility] by Savannah Jones for further evaluation.  On May 3, she had right diagnostic mammography and right breast ultrasound by Dr. Ardeen Jones and additional views showed a well-defined density in the right upper outer quadrant which was not palpable.  Ultrasound showed mixed echogenicity area measuring approximately 1.6 cm, correlating well with the mammogram.  There were no cysts and ultrasound of the right axilla was unremarkable.  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  With this information the patient was referred to Dr. Marlou Jones and on Sep 20, 2009, bilateral breast MRIs were obtained.  In the right breast, there was an irregular spiculated mass measuring 3.2 cm which was the biopsy proven cancer. There was a separate 1.6 cm area of mass like enhancement more anterior to the prior mass, but without a bridging area of enhancement.  If both masses are taking the intervening non-enhancing parenchyma, the total measurement was 7.5 cm.  The left breast was unremarkable.  Accordingly, the patient was recalled for repeat right ultrasonography on May 13, but the second mass noted by MRI could not be located by ultrasound.  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  E-cadherin stain shows weak membranous staining.  With this information, the patient is referred for further evaluation and treatment.  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  The two areas in question accordingly may be unrelated.  Her subsequent history is detailed below  INTERVAL HISTORY: Savannah Jones returns today for followup of her right breast cancer accompanied by her husband Savannah Jones. She continues on letrozole, which she is tolerating well, with no significant hot flashes and no complaints regarding vaginal dryness. She has osteoarthritis problems chiefly involving her knees, but does not have the arthralgias and myalgias that some patients can experience on this medication.  REVIEW OF SYSTEMS: [facility]. She tried meloxicam and that really helped her arthritis symptoms, but it upset her stomach so she has gone off that medication. She has started her self on a variety of vitamin supplements. A detailed review of systems today was otherwise entirely negative.  PAST MEDICAL HISTORY: Past Medical History  Diagnosis Date  . Hyperlipidemia   . IBS (irritable bowel syndrome)   . Arthritis   Significant for osteoarthritis, hypertension, history of interstitial cystitis treated most recently by Dr. Lawrence Jones, but not requiring any intervention currently.  She did have some irrigation treatments in the past and she has chronic microscopic hematuria as a result  of that.  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  She is status post  tonsillectomy, status post laparoscopic "surgery" looking for endometriosis to try to explain her difficulty getting pregnant, but she did not have endometriosis.  She has hypercholesterolemia, GERD which she describes as mild, history of irritable bowel syndrome associated with stress, history of squamous cell skin cancers removed by Dr. Ronnald Jones.  She has a history of tobacco abuse of a little less than 10 pack years.  PAST SURGICAL HISTORY: Past Surgical History  Procedure Laterality Date  . Mastectomy  03/05/10    negative sentinel bx, T1c no right breast cancer  . Breast surgery      FAMILY HISTORY The patient's father died at the age of 37 from a subarachnoid hemorrhage.  The patient's mother died at the age of 25 from complications of diabetes.  The patient is an only child.  GYNECOLOGIC HISTORY: She is Gx, P2, first pregnancy to term age 30.  She went through the change of life around age 49 and she never took hormones.  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 She knew Savannah Jones and Savannah Jones, that I work with briefly. Her husband Savannah Jones, is a retired Radio producer.  He used to Chief of Staff.  Son Savannah Jones works for [employer] in [place].  Daughter Savannah Jones  plans to be a Scientist, water quality.  The patient has 1 grandson. She attends a [employer].   ADVANCED DIRECTIVES:  HEALTH MAINTENANCE: History  Substance Use Topics  . Smoking status: Former Research scientist (life sciences)  . Smokeless tobacco: Former Systems developer    Quit date: 02/26/1974  . Alcohol Use: No     Colonoscopy:  May 2014, Dr. Cristina Jones  PAP:  July 2014, Savannah Jones  Bone density: 02/08/2013/[facility]/normal  Lipid panel:  Dr. Mayra Jones  Allergies  Allergen Reactions  . Sulfa Antibiotics     Obtain reaction/severity from patient.    Current Outpatient Prescriptions  Medication Sig Dispense Refill  . amLODipine (NORVASC) 10 MG tablet Take 10 mg by mouth daily.      Marland Kitchen letrozole (FEMARA) 2.5 MG  tablet TAKE 1 TABLET ONCE DAILY. 30 tablet 11  . lovastatin (MEVACOR) 40 MG tablet     . ramipril (ALTACE) 10 MG capsule Take 10 mg by mouth daily.       No current facility-administered medications for this visit.    OBJECTIVE: middle-aged white woman who appears stated age 69 Vitals:   07/12/14 1059  BP: 165/73  Pulse: 76  Temp: 98.3 F (36.8 C)  Resp: 18     Body mass index is 33.25 kg/(m^2).    ECOG FS: 1 Filed Weights   07/12/14 1059  Weight: 193 lb 12.8 oz (87.907 kg)    Sclerae unicteric, EOMs intact Oropharynx clear and moist-- no thrush or other lesions No cervical or supraclavicular adenopathy Lungs no rales or rhonchi Heart regular rate and rhythm Abd soft, nontender, positive bowel sounds MSK no focal spinal tenderness, no upper extremity lymphedema Neuro: nonfocal, well oriented, appropriate affect Breasts: The right breast is status post mastectomy. There is no evidence of chest wall recurrence. The right axilla is benign per the left breast is unremarkable  LAB RESULTS: Lab Results  Component Value Date   WBC 7.4 07/12/2014  NEUTROABS 4.6 07/12/2014   HGB 13.9 07/12/2014   HCT 43.4 07/12/2014   MCV 92.7 07/12/2014   PLT 266 07/12/2014      Chemistry      Component Value Date/Time   NA 140 06/13/2013 1513   NA 141 12/04/2011 1112   K 3.7 06/13/2013 1513   K 4.4 12/04/2011 1112   CL 100 06/08/2012 1101   CL 103 12/04/2011 1112   CO2 26 06/13/2013 1513   CO2 30 12/04/2011 1112   BUN 16.7 06/13/2013 1513   BUN 13 12/04/2011 1112   CREATININE 0.8 06/13/2013 1513   CREATININE 0.85 12/04/2011 1112      Component Value Date/Time   CALCIUM 9.8 06/13/2013 1513   CALCIUM 9.6 12/04/2011 1112   ALKPHOS 92 06/13/2013 1513   ALKPHOS 76 12/04/2011 1112   AST 17 06/13/2013 1513   AST 18 12/04/2011 1112   ALT 19 06/13/2013 1513   ALT 17 12/04/2011 1112   BILITOT 0.42 06/13/2013 1513   BILITOT 0.4 12/04/2011 1112        STUDIES:  CLINICAL  DATA: Screening.  EXAM: DIGITAL SCREENING UNILATERAL LEFT MAMMOGRAM WITH TOMO AND CAD  COMPARISON: Previous exam(s).  ACR Breast Density Category b: There are scattered areas of fibroglandular density.  FINDINGS: There are no findings suspicious for malignancy. Images were processed with CAD.  IMPRESSION: No mammographic evidence of malignancy. A result letter of this screening mammogram will be mailed directly to the patient.  RECOMMENDATION: Screening mammogram in one year. (Code:[id number]-B-01Y)  BI-RADS CATEGORY 1: Negative.   Electronically Signed  By: Savannah Newcomer M.D.  On: 10/10/2013 13:39   ASSESSMENT: 69 y.o.  [place] woman   (1) status post right breast biopsy May of 2011 for an invasive ductal carcinoma, estrogen receptor positive at 100%, but progesterone receptor negative at 0%, with a low proliferation fraction at 11% and an equivocal result by [facility] for HER2/neu amplification, with a ratio of 2.04.  (2)  treated neoadjuvantly with 5 cycles of docetaxel/ carboplatin/ trastuzumab, completed October of 2011, the 6th cycle held because of neuropathy,   (3) status post right mastectomy and sentinel lymph node sampling October of 2011 for what proved to be a ypT1c ypN0, strongly ER positive, weakly PR positive, clearly HER2 negative invasive ductal carcinoma with a low proliferation fraction.  She did not require postmastectomy radiation.   (4) on letrozole January to October of 2012, discontinued because of arthralgias, definitively resumed May 2013.  Plan is to continue to July 2017.  (a) Bone density 02/08/2013 was normal  PLAN: Sheritta continues to do well now more than 4 years out from her definitive surgery with no evidence of disease recurrence. She is tolerating the aromatase inhibitor letrozole without any side effects and obtain set at an excellent price. She be due for repeat bone density later this year. She usually obtains that through Dr.  Raul Del office.  We are developing a survivorship clinic and I discussed that with her today. She would be interested and proptosis abating. I will let our coordinator no so she can be scheduled sometime in the fall.  I will see Londin again in a year. She knows to call for any problems that may develop before that visit.MAGRINAT,GUSTAV C    07/12/2014

## 2014-07-12 NOTE — Telephone Encounter (Signed)
per pof to sch pt appt-gave pt copy of sch °

## 2014-08-28 ENCOUNTER — Other Ambulatory Visit: Payer: Self-pay | Admitting: Dermatology

## 2014-09-14 ENCOUNTER — Other Ambulatory Visit: Payer: Self-pay

## 2014-09-14 DIAGNOSIS — Z9011 Acquired absence of right breast and nipple: Secondary | ICD-10-CM

## 2014-09-14 DIAGNOSIS — Z1231 Encounter for screening mammogram for malignant neoplasm of breast: Secondary | ICD-10-CM

## 2014-10-16 ENCOUNTER — Ambulatory Visit
Admission: RE | Admit: 2014-10-16 | Discharge: 2014-10-16 | Disposition: A | Discharge status: Home / Self Care | Payer: Medicare Other | Source: Ambulatory Visit

## 2014-10-16 DIAGNOSIS — Z1231 Encounter for screening mammogram for malignant neoplasm of breast: Secondary | ICD-10-CM

## 2014-10-16 DIAGNOSIS — Z9011 Acquired absence of right breast and nipple: Secondary | ICD-10-CM

## 2014-12-26 ENCOUNTER — Other Ambulatory Visit: Payer: Self-pay | Admitting: Obstetrics and Gynecology

## 2014-12-27 LAB — CYTOLOGY - PAP

## 2015-01-03 ENCOUNTER — Telehealth: Payer: Self-pay | Admitting: Adult Health

## 2015-01-03 NOTE — Telephone Encounter (Signed)
I spoke with Savannah Jones to try to schedule her appt to see [place] in survivorship, at the request of Dr. Jana Hakim.  I briefly explained to her the purpose and goals of survivorship and how we can help participate in her care as a cancer survivor.  She agreed to come see [place] in survivorship and I have her scheduled to see Chestine Spore, NP in late September.  I gave Ms. Dollinger instructions on where to check in for this appt and encouraged her to call scheduling if something came up and she needed to reschedule this appt.  We look forward to participating in their care.   Mike Craze, NP [place] [phone]

## 2015-01-16 ENCOUNTER — Telehealth: Payer: Self-pay | Admitting: Oncology

## 2015-01-16 NOTE — Telephone Encounter (Signed)
Due to meeting moved 9/28 appointment with HM to PM. Left message for patient and mailed new schedule for September and f/u with [facility] March 2017.

## 2015-02-07 ENCOUNTER — Ambulatory Visit (HOSPITAL_BASED_OUTPATIENT_CLINIC_OR_DEPARTMENT_OTHER): Payer: Medicare Other | Admitting: Nurse Practitioner

## 2015-02-07 ENCOUNTER — Encounter: Payer: Self-pay | Admitting: Nurse Practitioner

## 2015-02-07 VITALS — BP 144/82 | HR 81 | Temp 98.4°F | Resp 16 | Ht 64.0 in | Wt 185.8 lb

## 2015-02-07 DIAGNOSIS — C50911 Malignant neoplasm of unspecified site of right female breast: Secondary | ICD-10-CM

## 2015-02-07 NOTE — Patient Instructions (Signed)
Thank you for coming in today!  As we discussed, I will follow up on your bone density results and give you a call once we review them. Please keep your follow up appointment with Dr. Jana Hakim in March 2017 and let us know if any new symptoms develop in the interim.  Continue on your letrozole.  If you notice any enlargement of the area on your chest prior to your appointment with Dr. Marlou Starks in November, either let us or his office know so that he can evaluate you sooner.   Symptoms to Watch for and Report to Your Provider  . Return of the cancer symptoms you had before- such as a lump or new growth where your cancer first started . New or unusual pain that seems unrelated to an injury and does not go away, including back pain or bone pain . Weight loss without trying/intending . Unexplained bleeding . A rash or allergic reaction, such as swelling, severe itching or wheezing . Chills or fevers . Persistent headaches . Shortness of breath or difficulty breathing . Bloody stools or blood in your urine . Lumps, bumps, swelling and/or nipple discharge . Nausea, vomiting, diarrhea, loss of appetite, or trouble swallowing . A cough that doesn't go away . Abdominal pain . Swelling in your arms or legs . Fractures . Hot flashes or other menopausal symptoms . Any other signs mentioned by your doctor or nurse or any unusual symptoms                 that you just can't explain   NOTE: Just because you have certain symptoms, it doesn't mean the cancer has come back or you have a new cancer. Symptoms can be due to other problems that need to be addressed.  It is important to watch for these symptoms and report them to your provider so you can be medically evaluated for any of these concerns!    Living a Life of Wellness After Cancer:  *Note: Please consult your health care provider before using any medications, supplements, over-the-counter products, or other interventions.  Also, please consult your  primary care provider before you begin any lifestyle program (diet, exercise, etc.).  Your safety is our top priority and we want to make sure you continue to live a long and healthy life!    Healthy Lifestyle Recommendations  As a cancer survivor, it is important develop a lifelong commitment to a healthy lifestyle. A healthy lifestyle can prevent cancer from returning as well as prevent other diseases like heart disease, diabetes and high blood pressure.  These are some things that you can do to have a healthy lifestyle:  Marland Kitchen Maintain a healthy weight.  . Exercise daily per your doctor's orders. . Eat a balanced diet high in fruits, vegetables, bran, and fiber. . Limit how much alcohol you consume, if at all. Ali Lowe regular bone mineral density testing for osteoporosis.  . Talk to your doctor about cardiovascular disease or "heart disease" screening. . Stop smoking (if you smoke). . Know your family history. . Be mindful of your emotional, social, and spiritual needs. . Meet regularly with a Primary Care Provider (PCP). Find a PCP if you do not already have one. . Talk to your doctor about regular cancer screening.

## 2015-02-07 NOTE — Progress Notes (Signed)
CLINIC:  Cancer Survivorship   REASON FOR VISIT:  Routine follow-up post-treatment for history of breast cancer.  BRIEF ONCOLOGIC HISTORY:    Breast cancer, right breast   09/11/2009 Breast US Right breast: Area of mixed echogenicity at the 9-10 o'clock position, 5 cm from the nipple which measures approximately 1.6 cm.    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 Overall, Savannah Jones reports feeling quite well.  She is now 5 years out from her diagnosis and continues on her letrozole with minimal toxicity.  Her only side effects are related to arthralgias, which existed prior to initation of therapy, but may be a bit worse since that time.  She denies any change along her right mastectomy incision. Savannah Jones does report that she noticed a "spot" on her right chest wall that was examined by Dr. Marlou Starks at her last visit approximately two months ago.  It has not considerably changed in size, although it is more pink in color.  She denies any itching, bleeding, or excoriation of the area.  Dr. Helane Rima examined it at her recent GYN appointment last week  and felt that it was consistent with a keloid.  She denies any mass or nodule within her left breast.  She reports a good appetite.  She states that she has been losing about 1 pound / month over the last few months after making dietary changes including reducing intake of sugar and processed foods.  She reports good energy and [facility]  She has had no headache, cough, shortness of breath, or pain.  Her last mammogram was in June 2016 and unremarkable.  REVIEW OF SYSTEMS:  General: Denies fever, chills, unintentional weight loss, or night sweats. Cardiac: Denies palpitations, chest pain, and lower extremity edema.  Respiratory: Denies dyspnea on exertion.  GI: Denies abdominal pain, constipation, diarrhea, nausea, or vomiting.  GU: Mild vaginal dryness.  Denies dysuria, hematuria, vaginal bleeding, or vaginal discharge. Musculoskeletal: Joint pain, as above, that makes it difficult to go up and down stairs, otherwise denies pain. Neuro: Denies headache or recent falls. Denies peripheral neuropathy. Skin: As above, otherwise, denies rash, pruritis, or open wounds.  Breast: Denies any new tenderness, nipple changes, or nipple discharge.  Psych: Denies depression, anxiety, insomnia, or memory loss.   A 14-point review of systems was completed and was negative, except as noted above.   ONCOLOGY TREATMENT TEAM:  1. Surgeon:  Dr[facility] at [facility]  2. Medical Oncologist: Dr. Jana Hakim    PAST MEDICAL/SURGICAL HISTORY:  Past Medical History  Diagnosis Date  . Hyperlipidemia   . IBS (irritable bowel  syndrome)   . Arthritis    Past Surgical History  Procedure Laterality Date  . Mastectomy  03/05/10    negative sentinel bx, T1c no right breast cancer  . Breast surgery       ALLERGIES:  Allergies  Allergen Reactions  . Sulfa Antibiotics     Obtain reaction/severity from patient.     CURRENT MEDICATIONS:  Current Outpatient  Prescriptions on File Prior to Visit  Medication Sig Dispense Refill  . cholecalciferol (VITAMIN D) 1000 UNITS tablet Take 1 tablet (1,000 Units total) by mouth daily.    Marland Kitchen letrozole (FEMARA) 2.5 MG tablet TAKE 1 TABLET ONCE DAILY. 30 tablet 11  . lovastatin (MEVACOR) 40 MG tablet     . pyridOXINE (VITAMIN B-6) 50 MG tablet Take 100 mg by mouth daily.    . ramipril (ALTACE) 10 MG capsule Take 10 mg by mouth daily.      . vitamin B-12 (CYANOCOBALAMIN) 250 MCG tablet Take 500 mcg by mouth daily.    . vitamin C (ASCORBIC ACID) 500 MG tablet Take 100 mg by mouth daily.     No current facility-administered medications on file prior to visit.     ONCOLOGIC FAMILY HISTORY:  Family History  Problem Relation Age of Onset  . Diabetes Mother   . Hypertension Father      GENETIC COUNSELING/TESTING: None  SOCIAL HISTORY:  Savannah Jones is married and lives with her spouse in [place], [place].  She has 2 children.  Savannah Jones is currently not working.  She is a former smoker and  denies any current or history of alcohol or illicit drug use.     PHYSICAL EXAMINATION:  Vital Signs:   Filed Vitals:   02/07/15 1250  BP: 144/82  Pulse: 81  Temp: 98.4 F (36.9 C)  Resp: 16   ECOG performance status: 1 General: Well-nourished, well-appearing female in no acute distress.  She is unaccompanied in clinic today.   HEENT: Head is atraumatic and normocephalic.  Pupils equal and reactive to light and accomodation. Conjunctivae clear without exudate.  Sclerae anicteric. Oral mucosa is pink, moist, and intact without lesions.  Oropharynx is pink without lesions or erythema.  Lymph: No cervical, supraclavicular, infraclavicular, or axillary lymphadenopathy noted on palpation.  Cardiovascular: Regular rate and rhythm without murmurs, rubs, or gallops. Respiratory: Clear to auscultation bilaterally. Chest expansion symmetric without accessory muscle use on inspiration or expiration.    Breast: Left breast without mass or nodule.  Right mastectomy incision intact without nodularity. GI: Abdomen soft and round. No tenderness to palpation. Bowel sounds normoactive in 4 quadrants. No hepatosplenomegaly.   GU: Deferred.  Musculoskeletal: Muscle strength 5/5 in all extremities.  Full ROM noted in all extremities.  Neuro: No focal deficits. Steady gait.  Psych: Mood and affect normal and appropriate for situation.  Extremities: No edema, cyanosis, or clubbing.  Skin: Small area ~ 0.5 cm in size over right chest wall, slightly raised, non vesicular, no evidence of ulceration, excoriation, or bleeding.  Otherwise, no rash or lesion.  LABORATORY DATA:  Recent Results (from the past 2160 hour(s))  Cytology - PAP     Status: None   Collection Time: 12/26/14 12:00 AM  Result Value Ref Range   CYTOLOGY - PAP PAP RESULT     DIAGNOSTIC IMAGING: Left sided mammogram performed October 16, 2014 reveals no evidence of malignancy; scattered fibroglandular density.   ASSESSMENT AND PLAN:   1. 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  Savannah Jones is doing well with no symptoms worrisome for cancer recurrence at this time. I have reviewed the recommendations for ongoing surveillance with her and she will follow-up with her medical oncologist,  Dr. Jana Hakim, in March 2017 with history and physical exam. She will continue her anti-estrogen therapy with letrozole as prescribed by Dr. Jana Hakim. She was instructed to make Dr. Jana Hakim or myself aware if she notes any change within her breast, any new symptoms such as pain, shortness of breath, weight loss, or fatigue.  She will also report any side effects of the endocrine therapy or  any difficulties with it. Regarding the lesion at her right chest wall, it does not appear to be consistent with malignancy.  It has not changed significantly in size.  Savannah Jones has a follow up appointment with Dr. Marlou Starks in one month's time for follow up.  I have advised her that if it changes in size or shape, begins to bleed, or becomes excoriated, that she notify us immediately so that further evaluation can be undertaken.  2. Bone health:  Given Savannah Jones's age/history of breast cancer and her current treatment regimen including anti-estrogen therapy with letrozole, she is at risk for bone demineralization. She underwent DEXA scan on January 31, 2015 at [facility] in [place].  Those results are not yet available to [place].  I will call Dr. Christen Butter office to request a copy and follow up with Savannah Jones accordingly.  In the meantime, she was encouraged to increase her consumption of foods rich in calcium and vitamin D as well as to increase her weight-bearing activities.  She was given education on specific activities to promote bone health.  3.  Cancer screening:  Due to Ms. Kurka's history and her age, she should receive screening for skin cancers, colon cancer, and gynecologic cancers.  The information and recommendations are listed as part of the wellness recommendations on her after visit summary.  4. Health maintenance and wellness promotion: Ms. Seat was encouraged to consume 5-7 servings of fruits and vegetables per day. She was also encouraged to continue engage in moderate to vigorous exercise for 30 minutes per day most days of the week. She was instructed to limit her alcohol consumption and continue to abstain from tobacco use.      A total of 35 minutes of face-to-face time was spent with this patient with greater than 50% of that time in counseling and care-coordination.   Sylvan Cheese, NP  Survivorship Program [facility] [phone]   Note: PRIMARY CARE PROVIDER Mayra Neer, [place] [phone]

## 2015-03-16 ENCOUNTER — Other Ambulatory Visit: Payer: Self-pay | Admitting: Oncology

## 2015-07-11 ENCOUNTER — Other Ambulatory Visit (HOSPITAL_BASED_OUTPATIENT_CLINIC_OR_DEPARTMENT_OTHER): Payer: Medicare HMO

## 2015-07-11 DIAGNOSIS — E559 Vitamin D deficiency, unspecified: Secondary | ICD-10-CM | POA: Diagnosis not present

## 2015-07-11 DIAGNOSIS — Z853 Personal history of malignant neoplasm of breast: Secondary | ICD-10-CM

## 2015-07-11 DIAGNOSIS — D0590 Unspecified type of carcinoma in situ of unspecified breast: Secondary | ICD-10-CM | POA: Diagnosis not present

## 2015-07-11 DIAGNOSIS — C50911 Malignant neoplasm of unspecified site of right female breast: Secondary | ICD-10-CM

## 2015-07-11 LAB — CBC WITH DIFFERENTIAL/PLATELET
BASO%: 0.9 % (ref 0.0–2.0)
BASOS ABS: 0.1 10*3/uL (ref 0.0–0.1)
EOS%: 1.5 % (ref 0.0–7.0)
Eosinophils Absolute: 0.1 10*3/uL (ref 0.0–0.5)
HEMATOCRIT: 43.1 % (ref 34.8–46.6)
HGB: 14.3 g/dL (ref 11.6–15.9)
LYMPH#: 2.1 10*3/uL (ref 0.9–3.3)
LYMPH%: 24.7 % (ref 14.0–49.7)
MCH: 30.1 pg (ref 25.1–34.0)
MCHC: 33.1 g/dL (ref 31.5–36.0)
MCV: 90.8 fL (ref 79.5–101.0)
MONO#: 0.5 10*3/uL (ref 0.1–0.9)
MONO%: 6 % (ref 0.0–14.0)
NEUT#: 5.6 10*3/uL (ref 1.5–6.5)
NEUT%: 66.9 % (ref 38.4–76.8)
Platelets: 285 10*3/uL (ref 145–400)
RBC: 4.75 10*6/uL (ref 3.70–5.45)
RDW: 13.5 % (ref 11.2–14.5)
WBC: 8.4 10*3/uL (ref 3.9–10.3)

## 2015-07-11 LAB — COMPREHENSIVE METABOLIC PANEL
ALT: 21 U/L (ref 0–55)
ANION GAP: 9 meq/L (ref 3–11)
AST: 19 U/L (ref 5–34)
Albumin: 3.9 g/dL (ref 3.5–5.0)
Alkaline Phosphatase: 88 U/L (ref 40–150)
BUN: 15.2 mg/dL (ref 7.0–26.0)
CALCIUM: 9.1 mg/dL (ref 8.4–10.4)
CHLORIDE: 104 meq/L (ref 98–109)
CO2: 27 mEq/L (ref 22–29)
Creatinine: 1 mg/dL (ref 0.6–1.1)
EGFR: 58 mL/min/{1.73_m2} — ABNORMAL LOW (ref >90)
Glucose: 106 mg/dl (ref 70–140)
POTASSIUM: 3.8 meq/L (ref 3.5–5.1)
Sodium: 140 mEq/L (ref 136–145)
Total Bilirubin: 0.42 mg/dL (ref 0.20–1.20)
Total Protein: 7.5 g/dL (ref 6.4–8.3)

## 2015-07-12 LAB — VITAMIN D 25 HYDROXY (VIT D DEFICIENCY, FRACTURES): Vitamin D, 25-Hydroxy: 36.3 ng/mL (ref 30.0–100.0)

## 2015-07-16 ENCOUNTER — Other Ambulatory Visit: Payer: Self-pay | Admitting: Oncology

## 2015-07-16 NOTE — Telephone Encounter (Signed)
Chart reviewed.

## 2015-07-17 DIAGNOSIS — H5202 Hypermetropia, left eye: Secondary | ICD-10-CM | POA: Diagnosis not present

## 2015-07-17 DIAGNOSIS — H53009 Unspecified amblyopia, unspecified eye: Secondary | ICD-10-CM | POA: Diagnosis not present

## 2015-07-17 DIAGNOSIS — H52222 Regular astigmatism, left eye: Secondary | ICD-10-CM | POA: Diagnosis not present

## 2015-07-17 DIAGNOSIS — H2589 Other age-related cataract: Secondary | ICD-10-CM | POA: Diagnosis not present

## 2015-07-17 DIAGNOSIS — H2512 Age-related nuclear cataract, left eye: Secondary | ICD-10-CM | POA: Diagnosis not present

## 2015-07-17 DIAGNOSIS — H53019 Deprivation amblyopia, unspecified eye: Secondary | ICD-10-CM | POA: Diagnosis not present

## 2015-07-18 ENCOUNTER — Telehealth: Payer: Self-pay | Admitting: Oncology

## 2015-07-18 ENCOUNTER — Ambulatory Visit (HOSPITAL_BASED_OUTPATIENT_CLINIC_OR_DEPARTMENT_OTHER): Payer: Medicare HMO | Admitting: Oncology

## 2015-07-18 VITALS — BP 133/70 | HR 80 | Temp 98.2°F | Resp 18 | Ht 64.0 in | Wt 183.6 lb

## 2015-07-18 DIAGNOSIS — Z170 Estrogen receptor positive status [ER+]: Secondary | ICD-10-CM

## 2015-07-18 DIAGNOSIS — C50011 Malignant neoplasm of nipple and areola, right female breast: Secondary | ICD-10-CM

## 2015-07-18 DIAGNOSIS — C50411 Malignant neoplasm of upper-outer quadrant of right female breast: Secondary | ICD-10-CM

## 2015-07-18 DIAGNOSIS — Z79811 Long term (current) use of aromatase inhibitors: Secondary | ICD-10-CM

## 2015-07-18 MED ORDER — LETROZOLE 2.5 MG PO TABS: 2.5000 mg | ORAL_TABLET | Freq: Every day | ORAL | Status: DC

## 2015-07-18 NOTE — Progress Notes (Signed)
Savannah Jones   DOB: 1946/05/12  MR#: [id number]  DZH#:[id number]   PCP:  Savannah Neer, MD GYN:  Savannah Queen, MD SU:  Savannah Messing, MD Other:  Savannah Lobo, MD, Savannah Arabian MD  CHIEF COMPLAINT: Estrogen receptor positive breast cancer  CURRENT TREATMENT: Letrozole  HISTORY OF PRESENT ILLNESS: From the original intake note:  The patient had an abnormal screening mammogram in April 2011 and she was [facility] by Savannah Jones for further evaluation.  On May 3, she had right diagnostic mammography and right breast ultrasound by Savannah Jones and additional views showed a well-defined density in the right upper outer quadrant which was not palpable.  Ultrasound showed mixed echogenicity area measuring approximately 1.6 cm, correlating well with the mammogram.  There were no cysts and ultrasound of the right axilla was unremarkable.  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  With this information the patient was referred to Savannah Jones and on Sep 20, 2009, bilateral breast MRIs were obtained.  In the right breast, there was an irregular spiculated mass measuring 3.2 cm which was the biopsy proven cancer. There was a separate 1.6 cm area of mass like enhancement more anterior to the prior mass, but without a bridging area of enhancement.  If both masses are taking the intervening non-enhancing parenchyma, the total measurement was 7.5 cm.  The left breast was unremarkable.  Accordingly, the patient was recalled for repeat right ultrasonography on May 13, but the second mass noted by MRI could not be located by ultrasound.  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  E-cadherin stain shows weak membranous staining.  With this information, the patient is referred for further evaluation and treatment.  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  The two areas in question accordingly may be unrelated.  Her subsequent history is detailed below  INTERVAL HISTORY: Savannah Jones, Savannah Jones. She tolerates the letrozole well, with no significant hot flashes problems. She does have some vaginal dryness issues which she discusses with her gynecologist, she tells me. She obtains a drug for approximately $10.  REVIEW OF SYSTEMS: She has no complaints and is very happy that they just had a second grandchild (both grandchildren live in [place]). She enjoyed meeting our breast survivorship nurse practitioner and felt that was a good complement to the medical visits. Chest some joint pain here and there which is not more intense or persistent than before. Otherwise a detailed review of systems today was noncontributory  PAST MEDICAL HISTORY: Past Medical History  Diagnosis Date  . Hyperlipidemia   . IBS (irritable bowel syndrome)   . Arthritis   Significant for osteoarthritis, hypertension, history of interstitial cystitis treated most recently by Dr. Lawrence Jones, but not requiring any intervention currently.  She did have some irrigation treatments in the past and she has chronic microscopic hematuria as a result  of that.  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  She is status post  tonsillectomy, status post laparoscopic "surgery" looking for endometriosis to try to explain her difficulty getting pregnant, but she did not have endometriosis.  She has hypercholesterolemia, GERD which she describes as mild, history of irritable bowel syndrome associated with stress, history of squamous cell skin cancers removed by Dr. Ronnald Jones.  She has a history of tobacco abuse of a little less than 10 pack years.  PAST SURGICAL HISTORY: Past Surgical History  Procedure Laterality Date  . Mastectomy  03/05/10    negative sentinel bx, T1c no right breast cancer  . Breast surgery      FAMILY HISTORY The patient's father died at the age of 32 from a subarachnoid hemorrhage.  The patient's mother died at the age of 36 from complications of diabetes.  The patient is an only child.  GYNECOLOGIC HISTORY: She is Gx, P2, first pregnancy to term age 13.  She went through the change of life around age 13 and she never took hormones.  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 She knew Savannah Jones and Savannah Jones, that I work with briefly. Her [name], is a retired Radio producer.  He used to Chief of Staff.  Son Savannah Jones works for [employer] in [place].  Daughter Savannah Jones  plans to be a Scientist, water quality.  The patient has 1 grandson. She attends a [employer].    ADVANCED DIRECTIVES:In place  HEALTH MAINTENANCE: Social History  Substance Use Topics  . Smoking status: Former Research scientist (life sciences)  . Smokeless tobacco: Former Systems developer    Quit date: 02/26/1974  . Alcohol Use: No     Colonoscopy:  May 2014, Dr. Cristina Jones  PAP:  July 2014, Savannah Jones  Bone density: 02/08/2013/[facility]/normal  Lipid panel:  Dr. Mayra Jones  Allergies  Allergen Reactions  . Sulfa Antibiotics     Obtain reaction/severity from patient.    Current Outpatient Prescriptions  Medication Sig Dispense Refill  . amLODipine (NORVASC) 5 MG tablet     . cholecalciferol (VITAMIN D) 1000 UNITS  tablet Take 1 tablet (1,000 Units total) by mouth daily.    Marland Kitchen letrozole (FEMARA) 2.5 MG tablet TAKE 1 TABLET ONCE DAILY. 30 tablet 4  . lovastatin (MEVACOR) 40 MG tablet     . pyridOXINE (VITAMIN B-6) 50 MG tablet Take 100 mg by mouth daily.    . ramipril (ALTACE) 10 MG capsule Take 10 mg by mouth daily.      . vitamin B-12 (CYANOCOBALAMIN) 250 MCG tablet Take 500 mcg by mouth daily.    . vitamin C (ASCORBIC ACID) 500 MG tablet Take 100 mg by mouth daily.     No current facility-administered medications for this visit.    OBJECTIVE: middle-aged white womanIn no acute distress Filed Vitals:   07/18/15 1307  BP: 133/70  Pulse: 80  Temp: 98.2 F (36.8 C)  Resp: 18     Body mass index is 31.5 kg/(m^2).    ECOG FS: 1 Filed Weights   07/18/15 1307  Weight: 183 lb 9.6 oz (83.28 kg)    Sclerae unicteric, pupils round and equal Oropharynx clear and moist-- no thrush or other lesions No cervical or supraclavicular adenopathy Lungs no rales or rhonchi Heart regular rate and  rhythm Abd soft, obese, nontender, positive bowel sounds MSK no focal spinal tenderness, no upper extremity lymphedema Neuro: nonfocal, well oriented, appropriate affect Breasts: The right breast is status post mastectomy. The chest wall is smooth, with no evidence of disease recurrence. The right axilla is benign. The left breast is unremarkable  LAB RESULTS: Lab Results  Component Value Date   WBC 8.4 07/11/2015   NEUTROABS 5.6 07/11/2015   HGB 14.3 07/11/2015   HCT 43.1 07/11/2015   MCV 90.8 07/11/2015   PLT 285 07/11/2015      Chemistry      Component Value Date/Time   NA 140 07/11/2015 1322   NA 141 12/04/2011 1112   K 3.8 07/11/2015 1322   K 4.4 12/04/2011 1112   CL 100 06/08/2012 1101   CL 103 12/04/2011 1112   CO2 27 07/11/2015 1322   CO2 30 12/04/2011 1112   BUN 15.2 07/11/2015 1322   BUN 13 12/04/2011 1112   CREATININE 1.0 07/11/2015 1322   CREATININE 0.85 12/04/2011 1112      Component  Value Date/Time   CALCIUM 9.1 07/11/2015 1322   CALCIUM 9.6 12/04/2011 1112   ALKPHOS 88 07/11/2015 1322   ALKPHOS 76 12/04/2011 1112   AST 19 07/11/2015 1322   AST 18 12/04/2011 1112   ALT 21 07/11/2015 1322   ALT 17 12/04/2011 1112   BILITOT 0.42 07/11/2015 1322   BILITOT 0.4 12/04/2011 1112        STUDIES: No results found.  ASSESSMENT: 70 y.o.  [place] woman   (1) status post right breast biopsy May of 2011 for an invasive ductal carcinoma, estrogen receptor positive at 100%, but progesterone receptor negative at 0%, with a low proliferation fraction at 11% and an equivocal result by [facility] for HER2/neu amplification, with a ratio of 2.04.  (2)  treated neoadjuvantly with 5 cycles of docetaxel/ carboplatin/ trastuzumab, completed October of 2011, the 6th cycle held because of neuropathy,   (3) status post right mastectomy and sentinel lymph node sampling October of 2011 for what proved to be a ypT1c ypN0, strongly ER positive, weakly PR positive, clearly HER2 negative invasive ductal carcinoma with a low proliferation fraction.  She did not require postmastectomy radiation.   (4) on letrozole January to October of 2012, discontinued because of arthralgias, definitively resumed May 2013.  Plan is to continue to July 2022  (a) Bone density 02/08/2013 was normal  PLAN: We discussed the fact that continuing an anastrozole an additional 5 years will reduce the risk of recurrence further by 3%. Unfortunately only 1% of that is going to be outside the breast recurrence. The other 2% refers to in breast recurrences, which she could deal with  Is a occur without compromising survival.  Although this is somewhat marginal, it is still very motivating for her. She just said supper with group of women friends and 2 of them reportedly recurrences. She really would like to do everything possible to avoid that. Accordingly we are continuing anastrozole to a total of 10 years  In addition she  is tolerating the anastrozole essentially with no side effects and obtaining it at practically.  The only flying the ointment is the question of bone density. She did have a normal one in 2014. She had a repeat bone density last year but we have not received a copy of that study. We are requesting a today. If there has been significant bone density loss we can consider bisphosphonates or denosumab.  She appreciated  meeting with the breast survivorship nurse practitioner and she will see her again this September. She will see me again in one year. She knows to call for any problems that may develop before the next visit here..     .MAGRINAT,GUSTAV C    07/18/2015

## 2015-07-18 NOTE — Telephone Encounter (Signed)
Schedule patient appt per pof, avs report printed.  °

## 2015-09-19 DIAGNOSIS — I10 Essential (primary) hypertension: Secondary | ICD-10-CM | POA: Diagnosis not present

## 2015-09-19 DIAGNOSIS — C50919 Malignant neoplasm of unspecified site of unspecified female breast: Secondary | ICD-10-CM | POA: Diagnosis not present

## 2015-09-19 DIAGNOSIS — R7301 Impaired fasting glucose: Secondary | ICD-10-CM | POA: Diagnosis not present

## 2015-09-19 DIAGNOSIS — K219 Gastro-esophageal reflux disease without esophagitis: Secondary | ICD-10-CM | POA: Diagnosis not present

## 2015-09-19 DIAGNOSIS — E782 Mixed hyperlipidemia: Secondary | ICD-10-CM | POA: Diagnosis not present

## 2015-09-19 DIAGNOSIS — K589 Irritable bowel syndrome without diarrhea: Secondary | ICD-10-CM | POA: Diagnosis not present

## 2015-09-19 DIAGNOSIS — L309 Dermatitis, unspecified: Secondary | ICD-10-CM | POA: Diagnosis not present

## 2015-09-19 DIAGNOSIS — Z0000 Encounter for general adult medical examination without abnormal findings: Secondary | ICD-10-CM | POA: Diagnosis not present

## 2015-09-19 DIAGNOSIS — M1990 Unspecified osteoarthritis, unspecified site: Secondary | ICD-10-CM | POA: Diagnosis not present

## 2015-09-19 DIAGNOSIS — E669 Obesity, unspecified: Secondary | ICD-10-CM | POA: Diagnosis not present

## 2015-09-21 ENCOUNTER — Other Ambulatory Visit: Payer: Self-pay

## 2015-09-21 DIAGNOSIS — Z1231 Encounter for screening mammogram for malignant neoplasm of breast: Secondary | ICD-10-CM

## 2015-09-25 ENCOUNTER — Encounter: Payer: Self-pay | Admitting: Adult Health

## 2015-09-25 NOTE — Progress Notes (Signed)
A birthday card was mailed to the patient today on behalf of the Survivorship Program at [facility].   Demari Gales, NP Survivorship Program [facility] [phone]  

## 2015-10-22 ENCOUNTER — Ambulatory Visit
Admission: RE | Admit: 2015-10-22 | Discharge: 2015-10-22 | Disposition: A | Discharge status: Home / Self Care | Payer: Medicare HMO | Source: Ambulatory Visit

## 2015-10-22 ENCOUNTER — Other Ambulatory Visit: Payer: Self-pay | Admitting: General Surgery

## 2015-10-22 DIAGNOSIS — Z1231 Encounter for screening mammogram for malignant neoplasm of breast: Secondary | ICD-10-CM

## 2015-11-08 DIAGNOSIS — L821 Other seborrheic keratosis: Secondary | ICD-10-CM | POA: Diagnosis not present

## 2015-11-08 DIAGNOSIS — D3612 Benign neoplasm of peripheral nerves and autonomic nervous system, upper limb, including shoulder: Secondary | ICD-10-CM | POA: Diagnosis not present

## 2015-11-08 DIAGNOSIS — D3611 Benign neoplasm of peripheral nerves and autonomic nervous system of face, head, and neck: Secondary | ICD-10-CM | POA: Diagnosis not present

## 2015-12-31 ENCOUNTER — Other Ambulatory Visit: Payer: Self-pay | Admitting: Oncology

## 2015-12-31 DIAGNOSIS — Q840 Congenital alopecia: Secondary | ICD-10-CM | POA: Diagnosis not present

## 2015-12-31 DIAGNOSIS — Z01419 Encounter for gynecological examination (general) (routine) without abnormal findings: Secondary | ICD-10-CM | POA: Diagnosis not present

## 2015-12-31 DIAGNOSIS — L659 Nonscarring hair loss, unspecified: Secondary | ICD-10-CM | POA: Diagnosis not present

## 2015-12-31 DIAGNOSIS — Z6832 Body mass index (BMI) 32.0-32.9, adult: Secondary | ICD-10-CM | POA: Diagnosis not present

## 2016-01-18 DIAGNOSIS — H5202 Hypermetropia, left eye: Secondary | ICD-10-CM | POA: Diagnosis not present

## 2016-01-18 DIAGNOSIS — H53001 Unspecified amblyopia, right eye: Secondary | ICD-10-CM | POA: Diagnosis not present

## 2016-01-18 DIAGNOSIS — H52222 Regular astigmatism, left eye: Secondary | ICD-10-CM | POA: Diagnosis not present

## 2016-01-22 ENCOUNTER — Encounter: Payer: Medicare HMO | Admitting: Nurse Practitioner

## 2016-01-22 ENCOUNTER — Other Ambulatory Visit: Payer: Medicare HMO

## 2016-01-25 ENCOUNTER — Other Ambulatory Visit (HOSPITAL_BASED_OUTPATIENT_CLINIC_OR_DEPARTMENT_OTHER): Payer: Medicare HMO

## 2016-01-25 ENCOUNTER — Encounter: Payer: Self-pay | Admitting: Adult Health

## 2016-01-25 ENCOUNTER — Ambulatory Visit (HOSPITAL_BASED_OUTPATIENT_CLINIC_OR_DEPARTMENT_OTHER): Payer: Medicare HMO | Admitting: Adult Health

## 2016-01-25 VITALS — BP 154/79 | HR 74 | Temp 98.4°F | Resp 18 | Ht 64.0 in | Wt 182.8 lb

## 2016-01-25 DIAGNOSIS — C50011 Malignant neoplasm of nipple and areola, right female breast: Secondary | ICD-10-CM

## 2016-01-25 DIAGNOSIS — C50411 Malignant neoplasm of upper-outer quadrant of right female breast: Secondary | ICD-10-CM | POA: Diagnosis not present

## 2016-01-25 DIAGNOSIS — Z170 Estrogen receptor positive status [ER+]: Secondary | ICD-10-CM | POA: Diagnosis not present

## 2016-01-25 DIAGNOSIS — Z79811 Long term (current) use of aromatase inhibitors: Secondary | ICD-10-CM

## 2016-01-25 DIAGNOSIS — Z1231 Encounter for screening mammogram for malignant neoplasm of breast: Secondary | ICD-10-CM

## 2016-01-25 LAB — CBC WITH DIFFERENTIAL/PLATELET
BASO%: 1.3 % (ref 0.0–2.0)
BASOS ABS: 0.1 10*3/uL (ref 0.0–0.1)
EOS%: 2 % (ref 0.0–7.0)
Eosinophils Absolute: 0.2 10*3/uL (ref 0.0–0.5)
HEMATOCRIT: 42.3 % (ref 34.8–46.6)
HGB: 14 g/dL (ref 11.6–15.9)
LYMPH#: 1.9 10*3/uL (ref 0.9–3.3)
LYMPH%: 23.6 % (ref 14.0–49.7)
MCH: 30.3 pg (ref 25.1–34.0)
MCHC: 33.1 g/dL (ref 31.5–36.0)
MCV: 91.5 fL (ref 79.5–101.0)
MONO#: 0.5 10*3/uL (ref 0.1–0.9)
MONO%: 5.9 % (ref 0.0–14.0)
NEUT#: 5.6 10*3/uL (ref 1.5–6.5)
NEUT%: 67.2 % (ref 38.4–76.8)
Platelets: 262 10*3/uL (ref 145–400)
RBC: 4.62 10*6/uL (ref 3.70–5.45)
RDW: 13.1 % (ref 11.2–14.5)
WBC: 8.3 10*3/uL (ref 3.9–10.3)

## 2016-01-25 LAB — COMPREHENSIVE METABOLIC PANEL
ALT: 17 U/L (ref 0–55)
AST: 17 U/L (ref 5–34)
Albumin: 3.8 g/dL (ref 3.5–5.0)
Alkaline Phosphatase: 93 U/L (ref 40–150)
Anion Gap: 10 mEq/L (ref 3–11)
BUN: 16.3 mg/dL (ref 7.0–26.0)
CALCIUM: 9.2 mg/dL (ref 8.4–10.4)
CHLORIDE: 106 meq/L (ref 98–109)
CO2: 26 mEq/L (ref 22–29)
Creatinine: 0.8 mg/dL (ref 0.6–1.1)
EGFR: 72 mL/min/{1.73_m2} — AB (ref >90)
Glucose: 82 mg/dl (ref 70–140)
POTASSIUM: 3.8 meq/L (ref 3.5–5.1)
Sodium: 142 mEq/L (ref 136–145)
Total Bilirubin: 0.53 mg/dL (ref 0.20–1.20)
Total Protein: 7.4 g/dL (ref 6.4–8.3)

## 2016-01-25 MED ORDER — LETROZOLE 2.5 MG PO TABS: 2.5000 mg | ORAL_TABLET | Freq: Every day | ORAL | 0 refills | Status: DC

## 2016-01-25 MED ORDER — LETROZOLE 2.5 MG PO TABS: 2.5000 mg | ORAL_TABLET | Freq: Every day | ORAL | 11 refills | Status: DC

## 2016-01-25 NOTE — Progress Notes (Signed)
CLINIC:  Survivorship   REASON FOR VISIT:  Routine follow-up for history of breast cancer.   BRIEF ONCOLOGIC HISTORY:    Breast cancer of upper-outer quadrant of right female breast ([place])   09/11/2009 Breast US    Right breast: Area of mixed echogenicity at the 9-10 o'clock position, 5 cm from the nipple which measures approximately 1.6 cm.       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 Savannah Jones presents to the [facility] today for routine follow-up for her history of breast cancer.  Overall, she reports feeling pretty well.  She is continuing to struggle with arthralgias, which she feels are worse than they were since her last visit to the cancer center 6 months ago.  She reports that she knows she has arthritis (which was present before her breast cancer diagnosis and subsequent anti-estrogen therapy), but the joint pains in her hips, knees, and feet are getting worse and affecting her daily life.  She has to avoid stairs now, which is frustrating for her. She has vaginal dryness that is worse.  Her hair is thinning, which she doesn't like.  She states, "I would rather  take the medicine, having some aches/pains and less hair, if it means my cancer won't come back."  She tells me about a few of her friends who have been diagnosed with recurrences recently and this is scary for her.  She endorses some memory problems, which she attributes to her history of chemo or her age. She is still able to be completely independent in her activities, but is most frustrated by the aches and pains.    Her last mammogram was in 10/2015 at the [facility].  She reports being unhappy that it took 2 weeks for her to get her results; this caused her a lot of anxiety in waiting to get the results despite calling their office to try to get them sooner.    REVIEW OF SYSTEMS:  Review of Systems  Constitutional: Negative.   HENT: Negative.   Respiratory: Negative.   Cardiovascular: Negative.   Gastrointestinal: Negative.   Genitourinary:       Some UTIs  Musculoskeletal: Positive for joint pain.  Neurological: Negative.   Endo/Heme/Allergies:       Hair thinning; she tells me that she had her thyroid checked recently and it was normal.   Psychiatric/Behavioral: Negative.   GU: Denies vaginal bleeding. (+) vaginal dryness.  Breast: Denies any new nodularity, masses, tenderness, nipple changes, or nipple discharge.  A 14-point review of systems was completed and was negative, except as noted above.    PAST MEDICAL/SURGICAL HISTORY:  Past Medical History:  Diagnosis Date  . Arthritis   . Hyperlipidemia   . IBS (irritable bowel syndrome)    Past Surgical History:  Procedure Laterality Date  . BREAST SURGERY    . MASTECTOMY  03/05/10   negative sentinel bx, T1c no right breast cancer     ALLERGIES:  Allergies  Allergen Reactions  . Sulfa Antibiotics     Obtain reaction/severity from patient.     CURRENT MEDICATIONS:  Outpatient Encounter Prescriptions as of 01/25/2016  Medication Sig Note  . cholecalciferol (VITAMIN D) 1000 UNITS tablet Take 1 tablet (1,000  Units total) by mouth daily.   . letrozole (FEMARA) 2.5 MG tablet TAKE 1 TABLET ONCE DAILY.   . lovastatin (MEVACOR) 40 MG tablet    . pyridOXINE (VITAMIN B-6) 50 MG tablet Take 100 mg by mouth daily.   . ramipril (ALTACE) 10 MG capsule Take 10 mg by mouth daily.     . vitamin B-12 (CYANOCOBALAMIN) 250 MCG tablet Take 500 mcg by mouth daily.   . vitamin C (ASCORBIC ACID) 500 MG tablet Take 100 mg by mouth daily.   . [DISCONTINUED] amLODipine (NORVASC) 5 MG tablet  02/07/2015: Received from: External Pharmacy   No facility-administered encounter medications on file as of 01/25/2016.      ONCOLOGIC FAMILY HISTORY:  Family History  Problem Relation Age of Onset  . Diabetes Mother   . Hypertension Father     GENETIC COUNSELING/TESTING: No records available for review.   SOCIAL HISTORY:  Brayli Berkovich is married and lives with her husband in [place], [place].  They have 2 children and 2 grandchildren. 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 Denies any tobacco, alcohol, or illicit drug use.      PHYSICAL EXAMINATION:  Vital Signs: Vitals:   01/25/16 1045  BP: (!) 154/79  Pulse: 74  Resp: 18  Temp: 98.4 F (36.9 C)   Filed Weights   01/25/16 1045  Weight: 182 lb 12.8 oz (82.9 kg)   General: Well-nourished, well-appearing female in no acute distress.  She is unaccompanied today.   HEENT: Head is normocephalic.  Pupils equal and reactive to light. Conjunctivae clear without exudate.  Sclerae anicteric. Oral mucosa is pink, moist.  Oropharynx is pink without lesions or erythema.  Lymph: No cervical, supraclavicular, or infraclavicular lymphadenopathy noted on palpation.  Cardiovascular: Regular rate and rhythm.. Respiratory: Clear to auscultation bilaterally. Chest expansion symmetric; breathing non-labored.  Breast Exam:  -Left breast: No appreciable masses on palpation. No skin redness, thickening, or peau d'orange  appearance. -Right breast: s/p mastectomy; no skin redness or thickening. No palpable masses or nodularity.  -Axilla: No axillary adenopathy bilaterally.  GI: Abdomen soft and round; non-tender, non-distended. Bowel sounds normoactive. No hepatosplenomegaly.   GU: Deferred.  Neuro: No focal deficits. Steady gait.  Psych: Mood and affect normal and appropriate for situation.  Extremities: No edema.  Skin: Warm and dry.  LABORATORY DATA:  CBC    Component Value Date/Time   WBC 8.3 01/25/2016 1027   WBC 10.9 (H) 02/28/2010 1221   RBC 4.62 01/25/2016 1027   RBC 3.68 (L) 02/28/2010 1221   HGB 14.0 01/25/2016 1027   HCT 42.3 01/25/2016 1027   PLT 262 01/25/2016 1027   MCV 91.5 01/25/2016 1027   MCH 30.3 01/25/2016 1027   MCH 34.5 (  H) 02/28/2010 1221   MCHC 33.1 01/25/2016 1027   MCHC 33.1 02/28/2010 1221   RDW 13.1 01/25/2016 1027   LYMPHSABS 1.9 01/25/2016 1027   MONOABS 0.5 01/25/2016 1027   EOSABS 0.2 01/25/2016 1027   BASOSABS 0.1 01/25/2016 1027   CMP Latest Ref Rng & Units 01/25/2016 07/11/2015 07/12/2014  Glucose 70 - 140 mg/dl 82 106 91  BUN 7.0 - 26.0 mg/dL 16.3 15.2 12.3  Creatinine 0.6 - 1.1 mg/dL 0.8 1.0 0.9  Sodium 136 - 145 mEq/L 142 140 141  Potassium 3.5 - 5.1 mEq/L 3.8 3.8 3.9  Chloride 98 - 107 mEq/L - - -  CO2 22 - 29 mEq/L _0 Calcium 8.4 - 10.4 mg/dL 9.2 9.1 9.5  Total Protein 6.4 - 8.3 g/dL 7.4 7.5 7.2  Total Bilirubin 0.20 - 1.20 mg/dL 0.53 0.42 0.53  Alkaline Phos 40 - 150 U/L 93 88 97  AST 5 - 34 U/L _1 ALT 0 - 55 U/L _2 *Labs reviewed and are largely stable/within normal limits.    DIAGNOSTIC IMAGING:  Most recent mammogram: 10/23/15     ASSESSMENT AND PLAN:  Ms.. Riddell is a pleasant 70 y.o. female with history of Stage IIA right breast invasive ductal carcinoma, ER+/PR-/HER2 equivocal, diagnosed in 09/2009; treated with neoadjuvant chemo with Taxotere/Cytoxan/Herceptin x 5 cycles (6th cycle held d/t peripheral neuropathy).   She went on to have right mastectomy, ER+/PR+/HER2 then negative. She did not require post-mastectomy radiation.  She began anti-estrogen therapy with Letrozole in 02/2011, was briefly held due to arthralgias, then resumed in 09/2011; planned duration of treatment is 10 years total (through 11/2020).  She [facility] to the [facility]orship [facility] for surveillance and routine follow-up.   1. History of Stage IIA right breast cancer:  Ms. Wainwright is currently clinically and radiographically without evidence of disease or recurrence of breast cancer. Her unilateral left breast screening mammogram was done in 10/2015 and was normal; orders were placed for her annual mammogram due in 10/2016 at the [facility]; I let her know that I would make a note to call her with the results of the mammogram when I received them, so that she would not have to wait to receive the letter in the mail with her results.  She voiced appreciation for this plan.  She will follow-up with Dr. Jana Hakim in 07/2016 with history and physical exam per surveillance protocol.  We discussed that he may decide to begin seeing her annually at that time, rather than every 6 months.  She could also continue to alternate with Survivorship NP after that time as well, if she chooses.  She was fine with this plan.  I encouraged her to call me with any questions or concerns in the interim and I would be happy to see her sooner, if needed.  2. Question regarding continued anti-estrogen therapy beyond 5 years: Ms. Hawn is struggling with the letrozole and has anxiety about stopping the medication.  We discussed that there is some data that suggests that 10 years of anti-estrogen therapy is better than 5 years, in some patients.  She is very fearful of cancer recurrence, particularly if she stops the letrozole due to side effects.  However, the letrozole is affecting her overall quality of life as it relates to pain and her ability to do the activities  she enjoys doing because of arthalgias.  We discussed the Breast Cancer Index (BCI) test, which  helps women like Ms. Kama make decisions on extending anti-estrogen therapy beyond 5 years. I explained that her tumor is sent off to a specialty lab and analyzed; the results we receive include a prognostic risk of recurrence with a "low" or "high" score, as well as a predictive risk of benefit from extended endocrine therapy with a "low likelihood" or "high likelihood."  The BCI is a great tool to aid women like Ms. Pomeroy in making informed decisions regarding prolonged anti-estrogen therapy.  Given that she has so many side effects, but is fearful of recurrence and wants "to do everything" to reduce her risk of the cancer coming back, the BCI will help in that decision making.  We reviewed that possible results of the BCI can be discordant (meaning it may show high-risk of recurrence, but low likelihood benefit of extended therapy), at which point we could have a discussion about what may be best for her going forward.  She agreed to have BCI testing done and I have asked our Breast Nurse Navigators to initiate the requisition for testing.  I let the patient know that it may take about 1 month to get the results back, but that I would call her when they became available.  For now, she will continue on the letrozole as previously prescribed.  She is pleased with this plan.   3. Bone health:  Given Ms. Essner's age, history of breast cancer, and her current anti-estrogen therapy with letrozole, she is at risk for bone demineralization. Her last DEXA scan was done sometime in 01/2015 at [facility]; I have asked Marlon Pel, RN to help me get the results of that scan so that we may update our records.  To the patient's knowledge, the DEXA scan showed normal bone density.  She will be eligible for testing again in 2018, which I will defer to her PCP/GYN who ordered the previous DEXA scan.      Dispo:   -Breast Cancer Index ordered today; will call patient with results when available.  -Return to cancer center to see Dr. Jana Hakim in 07/2016.  -Annual left breast screening mammogram due 10/2016; orders placed today.    A total of 35 minutes of face-to-face time was spent with this patient with greater than 50% of that time in counseling and care-coordination.   Mike Craze, NP Survivorship Program [place] [phone]   Note: PRIMARY CARE PROVIDER Mayra Neer, [place] [phone]

## 2016-01-25 NOTE — Progress Notes (Signed)
Pt "does not take flu vaccine"

## 2016-01-29 ENCOUNTER — Telehealth: Payer: Self-pay | Admitting: *Deleted

## 2016-01-29 NOTE — Telephone Encounter (Signed)
Received order for BCI testing. Requisition sent to pathology and [facility]

## 2016-02-12 ENCOUNTER — Telehealth: Payer: Self-pay | Admitting: *Deleted

## 2016-02-12 NOTE — Telephone Encounter (Signed)
Received call from Erica/[facility] stating that she faxed a request for additional information on 02/05/16.  She needs additional reports on mastectomy or lumpectomy or chart notes on lymph node status.  She can be reached at [phone].  Message routed to Dr Magrinat/Pod RN

## 2016-02-12 NOTE — Telephone Encounter (Signed)
BCI is managed through navigators - order was placed per visit with Savannah Jones NO.  This message forwarded to Savannah Jones per note stating BCI testing notified.

## 2016-03-10 DIAGNOSIS — C50411 Malignant neoplasm of upper-outer quadrant of right female breast: Secondary | ICD-10-CM | POA: Diagnosis not present

## 2016-03-18 ENCOUNTER — Telehealth: Payer: Self-pay | Admitting: *Deleted

## 2016-03-18 ENCOUNTER — Encounter (HOSPITAL_COMMUNITY): Payer: Self-pay

## 2016-03-18 ENCOUNTER — Telehealth: Payer: Self-pay | Admitting: Adult Health

## 2016-03-18 NOTE — Telephone Encounter (Signed)
Received Breast Cancer Index results of High Risk - 9.1% Late Recurrence.  Placed a copy of results in Dr. Virgie Dad box, gave Varney Biles a copy and took a copy to HIM to scan.

## 2016-03-18 NOTE — Telephone Encounter (Signed)
I received a call from Ms. Illes with questions if we had receive the results of her breast cancer index testing.  In reviewing her records, it was noted that the results were received today. I retrieve the results and discuss them in detail with the patient today via phone.  BCI results indicate high-risk of recurrence (9.1%), with also high likelihood of benefit from extended anti-estrogen therapy.  I also discussed these results with Dr. Jana Hakim, who recommends that she continue her antiestrogen therapy based on these results. I shared our recommendations with Ms. Schwieterman.  She continues to have multiple joint concerns, particularly in her knees. She is concerned that one of her calves is more swollen than the other and tells me she knows that anti-estrogen therapy increases risk of blood clots. She denies any pain to the calf or redness in the skin, only swelling. She tells me her PCP has noticed this as well. Clinically, her symptoms do not sound concerning for blood clot, but since I have not examined her since these new findings, I encouraged her to reach out to her PCP and they can order ultrasound to evaluate for DVT.  With regards to the BCI results, I encouraged her to think about both the pros and cons of continuing anti-estrogn therapy. I recommended that she maintain her letrozole all until she sees Dr. Jana Hakim in 07/2016, where they can have additional risk/benefit discussions at that time. She agreed with this plan.  She requested a copy of the BCI results; I have made a copy and mail them to her home today. I encouraged her to call me with any additional questions or concerns regarding these test results or with any other issues. She voiced appreciation for reviewing these results with her today.   Mike Craze, NP [place] [phone]

## 2016-03-19 ENCOUNTER — Telehealth: Payer: Self-pay | Admitting: *Deleted

## 2016-03-19 NOTE — Telephone Encounter (Signed)
Mailed BCI results to pt per MD request.

## 2016-04-08 DIAGNOSIS — C50911 Malignant neoplasm of unspecified site of right female breast: Secondary | ICD-10-CM | POA: Diagnosis not present

## 2016-04-28 DIAGNOSIS — C50911 Malignant neoplasm of unspecified site of right female breast: Secondary | ICD-10-CM | POA: Diagnosis not present

## 2016-05-07 DIAGNOSIS — J069 Acute upper respiratory infection, unspecified: Secondary | ICD-10-CM | POA: Diagnosis not present

## 2016-07-04 ENCOUNTER — Telehealth: Payer: Self-pay | Admitting: Oncology

## 2016-07-04 NOTE — Telephone Encounter (Signed)
Left a message on VM about appointment changes and if she had any questions or concerns to call us back with the [phone] number

## 2016-07-17 ENCOUNTER — Other Ambulatory Visit: Payer: Medicare HMO

## 2016-07-17 ENCOUNTER — Ambulatory Visit: Payer: Medicare HMO | Admitting: Oncology

## 2016-07-22 ENCOUNTER — Telehealth: Payer: Self-pay | Admitting: Oncology

## 2016-07-22 NOTE — Telephone Encounter (Signed)
Patient needs to reschedule her appointment on 3/16

## 2016-07-23 ENCOUNTER — Telehealth: Payer: Self-pay | Admitting: Oncology

## 2016-07-23 NOTE — Telephone Encounter (Signed)
Patient called to reschedule appointments. Date and time per patient request.

## 2016-07-25 ENCOUNTER — Other Ambulatory Visit: Payer: Medicare HMO

## 2016-07-25 ENCOUNTER — Ambulatory Visit: Payer: Medicare HMO | Admitting: Oncology

## 2016-08-06 ENCOUNTER — Other Ambulatory Visit (HOSPITAL_BASED_OUTPATIENT_CLINIC_OR_DEPARTMENT_OTHER): Payer: Medicare HMO

## 2016-08-06 ENCOUNTER — Ambulatory Visit (HOSPITAL_BASED_OUTPATIENT_CLINIC_OR_DEPARTMENT_OTHER): Payer: Medicare HMO | Admitting: Oncology

## 2016-08-06 VITALS — BP 135/99 | HR 78 | Temp 98.2°F | Resp 18 | Ht 64.0 in | Wt 177.4 lb

## 2016-08-06 DIAGNOSIS — C50911 Malignant neoplasm of unspecified site of right female breast: Secondary | ICD-10-CM | POA: Diagnosis not present

## 2016-08-06 DIAGNOSIS — Z170 Estrogen receptor positive status [ER+]: Secondary | ICD-10-CM

## 2016-08-06 DIAGNOSIS — Z79811 Long term (current) use of aromatase inhibitors: Secondary | ICD-10-CM | POA: Diagnosis not present

## 2016-08-06 DIAGNOSIS — M8580 Other specified disorders of bone density and structure, unspecified site: Secondary | ICD-10-CM

## 2016-08-06 DIAGNOSIS — C50411 Malignant neoplasm of upper-outer quadrant of right female breast: Secondary | ICD-10-CM

## 2016-08-06 DIAGNOSIS — C50011 Malignant neoplasm of nipple and areola, right female breast: Secondary | ICD-10-CM

## 2016-08-06 LAB — CBC WITH DIFFERENTIAL/PLATELET
BASO%: 1.1 % (ref 0.0–2.0)
BASOS ABS: 0.1 10*3/uL (ref 0.0–0.1)
EOS ABS: 0.1 10*3/uL (ref 0.0–0.5)
EOS%: 2 % (ref 0.0–7.0)
HEMATOCRIT: 46.8 % — AB (ref 34.8–46.6)
HEMOGLOBIN: 15.9 g/dL (ref 11.6–15.9)
LYMPH#: 2 10*3/uL (ref 0.9–3.3)
LYMPH%: 28.7 % (ref 14.0–49.7)
MCH: 31 pg (ref 25.1–34.0)
MCHC: 33.9 g/dL (ref 31.5–36.0)
MCV: 91.6 fL (ref 79.5–101.0)
MONO#: 0.5 10*3/uL (ref 0.1–0.9)
MONO%: 6.5 % (ref 0.0–14.0)
NEUT#: 4.4 10*3/uL (ref 1.5–6.5)
NEUT%: 61.7 % (ref 38.4–76.8)
PLATELETS: 286 10*3/uL (ref 145–400)
RBC: 5.12 10*6/uL (ref 3.70–5.45)
RDW: 13.5 % (ref 11.2–14.5)
WBC: 7.1 10*3/uL (ref 3.9–10.3)

## 2016-08-06 LAB — COMPREHENSIVE METABOLIC PANEL
ALBUMIN: 4.5 g/dL (ref 3.5–5.0)
ALK PHOS: 90 U/L (ref 40–150)
ALT: 27 U/L (ref 0–55)
ANION GAP: 12 meq/L — AB (ref 3–11)
AST: 26 U/L (ref 5–34)
BUN: 12.1 mg/dL (ref 7.0–26.0)
CALCIUM: 9.8 mg/dL (ref 8.4–10.4)
CHLORIDE: 107 meq/L (ref 98–109)
CO2: 23 mEq/L (ref 22–29)
Creatinine: 0.8 mg/dL (ref 0.6–1.1)
EGFR: 71 mL/min/{1.73_m2} — ABNORMAL LOW (ref >90)
Glucose: 94 mg/dl (ref 70–140)
POTASSIUM: 3.5 meq/L (ref 3.5–5.1)
Sodium: 143 mEq/L (ref 136–145)
Total Bilirubin: 0.51 mg/dL (ref 0.20–1.20)
Total Protein: 8.3 g/dL (ref 6.4–8.3)

## 2016-08-06 NOTE — Progress Notes (Signed)
IDBryan Omura   DOB: [birth year]  MR#: [id number]  IHK#:[id number]   PCP:  Mayra Neer, MD GYN:  Dian Queen, MD SU:  Autumn Messing, MD Other:  Ronald Lobo, MD, Gaynelle Arabian MD  CHIEF COMPLAINT: Estrogen receptor positive breast cancer  CURRENT TREATMENT: Letrozole  HISTORY OF PRESENT ILLNESS: From the original intake note:  The patient had an abnormal screening mammogram in April 2011 and she was [facility] by Dr. Helane Rima for further evaluation.  On May 3, she had right diagnostic mammography and right breast ultrasound by Dr. Ardeen Garland and additional views showed a well-defined density in the right upper outer quadrant which was not palpable.  Ultrasound showed mixed echogenicity area measuring approximately 1.6 cm, correlating well with the mammogram.  There were no cysts and ultrasound of the right axilla was unremarkable.  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  With this information the patient was referred to Dr. Marlou Starks and on Sep 20, 2009, bilateral breast MRIs were obtained.  In the right breast, there was an irregular spiculated mass measuring 3.2 cm which was the biopsy proven cancer. There was a separate 1.6 cm area of mass like enhancement more anterior to the prior mass, but without a bridging area of enhancement.  If both masses are taking the intervening non-enhancing parenchyma, the total measurement was 7.5 cm.  The left breast was unremarkable.  Accordingly, the patient was recalled for repeat right ultrasonography on May 13, but the second mass noted by MRI could not be located by ultrasound.  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  E-cadherin stain shows weak membranous staining.  With this information, the patient is referred for further evaluation and treatment.  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  The two areas in question accordingly may be unrelated.  Her subsequent history is detailed below  INTERVAL HISTORY: Kamaiyah follow-up of her estrogen receptor positive breast cancer, accompanied by her husband. The patient continues on  Letrozole, with good tolerance. Hot flashes and vaginal dryness are not a major issue. She never developed the arthralgias or myalgias that many patients can experience on this medication. She obtains it at a good price.  REVIEW OF SYSTEMS: She Has a little bit of sinus trouble already a, although spraying really has not yet arrived. She says this is standard for her. She has some knee problems, which are not different from before. She is not exercising on a regular basis. She feels her hair is thinning. She tells me her mother at the same problem and of course she understands that letrozole can aggravate this although it might be the same with or without that medication. Aside from these issues a detailed review of systems today was stable  PAST MEDICAL HISTORY: Past Medical History:  Diagnosis Date  . Arthritis   . Hyperlipidemia   . IBS (irritable bowel syndrome)   Significant for osteoarthritis, hypertension, history of interstitial cystitis treated most  recently by Dr. Lawrence Santiago, but not requiring any intervention currently.  She did have some irrigation treatments in the past and she has chronic microscopic hematuria as a result of that.  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  She is status post tonsillectomy, status post laparoscopic "surgery" looking for endometriosis to try to explain her difficulty getting pregnant, but she did not have endometriosis.  She has hypercholesterolemia, GERD which she describes as mild, history of irritable bowel syndrome associated with stress, history of squamous cell skin cancers removed by Dr. Ronnald Ramp.  She has a history of tobacco abuse of a little less than 10 pack years.  PAST SURGICAL HISTORY: Past Surgical History:  Procedure Laterality Date  . BREAST SURGERY    . MASTECTOMY  03/05/10   negative sentinel bx, T1c no right breast cancer    FAMILY HISTORY The patient's father died at the age of 55 from a subarachnoid hemorrhage.  The patient's mother died at the age of 55 from complications of diabetes.  The patient is an only child.  GYNECOLOGIC HISTORY: She is Gx, P2, first pregnancy to term age 71.  She went through the change of life around age 71 and she never took hormones.  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 She knew [name] and [name], that I work with briefly. Her husband Joneen Boers, is a retired Radio producer.  He used to Chief of Staff.  Son [name] works for [employer] in [place].  Daughter [name]  plans to be a Scientist, water quality.  The patient has 1 grandson. She attends a [employer].    ADVANCED DIRECTIVES:In place  HEALTH MAINTENANCE: Social History  Substance Use Topics  . Smoking status: Former Research scientist (life sciences)  . Smokeless tobacco: Former Systems developer    Quit date: 02/26/1974  . Alcohol use No     Colonoscopy:  May 2014, Dr. Cristina Gong  PAP:  July 2014, Dr. Helane Rima  Bone density: 02/08/2013/[facility]/normal  Lipid panel:  Dr. Mayra Neer  Allergies  Allergen Reactions  . Sulfa Antibiotics     Obtain reaction/severity from patient.    Current Outpatient  Prescriptions  Medication Sig Dispense Refill  . cholecalciferol (VITAMIN D) 1000 UNITS tablet Take 1 tablet (1,000 Units total) by mouth daily.    Marland Kitchen letrozole (FEMARA) 2.5 MG tablet Take 1 tablet (2.5 mg total) by mouth daily. 30 tablet 11  . lovastatin (MEVACOR) 40 MG tablet     . pyridOXINE (VITAMIN B-6) 50 MG tablet Take 100 mg by mouth daily.    . ramipril (ALTACE) 10 MG capsule Take 10 mg by mouth daily.      . vitamin B-12 (CYANOCOBALAMIN) 250 MCG tablet Take 500 mcg by mouth daily.    . vitamin C (ASCORBIC ACID) 500 MG tablet Take 100 mg by mouth daily.     No current facility-administered medications for this visit.     OBJECTIVE: middle-aged white woman in no acute distress  Vitals:   08/06/16 0928  BP: (!) 135/99  Pulse: 78  Resp: 18  Temp: 98.2 F (36.8 C)     Body mass index is 30.45 kg/m.    ECOG FS: 1 Filed Weights   08/06/16 0928  Weight: 177 lb 6.4 oz (80.5 kg)    Sclerae unicteric, EOMs  intact Oropharynx clear and moist No cervical or supraclavicular adenopathy Lungs no rales or rhonchi Heart regular rate and rhythm Abd soft, nontender, positive bowel sounds MSK no focal spinal tenderness, no upper extremity lymphedema Neuro: nonfocal, well oriented, appropriate affect Breasts: The right breast has undergone mastectomy. There is no evidence of chest wall recurrence. The left breast is unremarkable. Both axillae are benign.  LAB RESULTS: Lab Results  Component Value Date   WBC 7.1 08/06/2016   NEUTROABS 4.4 08/06/2016   HGB 15.9 08/06/2016   HCT 46.8 (H) 08/06/2016   MCV 91.6 08/06/2016   PLT 286 08/06/2016      Chemistry      Component Value Date/Time   NA 142 01/25/2016 1027   K 3.8 01/25/2016 1027   CL 100 06/08/2012 1101   CO2 26 01/25/2016 1027   BUN 16.3 01/25/2016 1027   CREATININE 0.8 01/25/2016 1027      Component Value Date/Time   CALCIUM 9.2 01/25/2016 1027   ALKPHOS 93 01/25/2016 1027   AST 17 01/25/2016 1027   ALT 17  01/25/2016 1027   BILITOT 0.53 01/25/2016 1027        STUDIES: Mammography at the [facility] 10/22/2015 showed the breast density to be category B. There was no evidence of malignancy. ASSESSMENT: 71 y.o.  [place] woman   (1) status post right breast biopsy May of 2011 for an invasive ductal carcinoma, estrogen receptor positive at 100%, but progesterone receptor negative at 0%, with a low proliferation fraction at 11% and an equivocal result by C[facility] for HER2/neu amplification, with a ratio of 2.04.  (2)  treated neoadjuvantly with 5 cycles of docetaxel/ carboplatin/ trastuzumab, completed October of 2011, the 6th cycle held because of neuropathy,   (3) status post right mastectomy and sentinel lymph node sampling October of 2011 for what proved to be a ypT1c ypN0, strongly ER positive, weakly PR positive, clearly HER2 negative invasive ductal carcinoma with a low proliferation fraction.  She did not require postmastectomy radiation.   (4) on letrozole January to October of 2012, discontinued because of arthralgias, definitively resumed May 2013.  Plan is to continue to July 2022  (a) Bone density 02/08/2013 was normal  PLAN: He continues on letrozole with exlent tolerance. We discussed the fact that recent data shows 7 years of letrozole is as good as 10 in terms of risk reduction and has fewer long-term complications.  Accordingly she will see me one last time a year from now--before then we are going to obtain a repeat bone scar density scan, which will be done in October at [facility]  I have encouraged her to exercise more particular doing weightbearing exercise.  She knows to call for any other problems that may develop before her next visit.  Marland KitchenMAGRINAT,Josanne Boerema C    08/06/2016

## 2016-10-17 DIAGNOSIS — N390 Urinary tract infection, site not specified: Secondary | ICD-10-CM | POA: Diagnosis not present

## 2016-10-17 DIAGNOSIS — R809 Proteinuria, unspecified: Secondary | ICD-10-CM | POA: Diagnosis not present

## 2016-10-27 ENCOUNTER — Encounter (INDEPENDENT_AMBULATORY_CARE_PROVIDER_SITE_OTHER): Payer: Self-pay

## 2016-10-27 ENCOUNTER — Ambulatory Visit
Admission: RE | Admit: 2016-10-27 | Discharge: 2016-10-27 | Disposition: A | Discharge status: Home / Self Care | Payer: Medicare HMO | Source: Ambulatory Visit | Attending: Adult Health | Admitting: Adult Health

## 2016-10-27 DIAGNOSIS — Z1231 Encounter for screening mammogram for malignant neoplasm of breast: Secondary | ICD-10-CM | POA: Diagnosis not present

## 2016-10-27 HISTORY — DX: Personal history of antineoplastic chemotherapy: Z92.21

## 2016-10-28 ENCOUNTER — Other Ambulatory Visit: Payer: Self-pay | Admitting: *Deleted

## 2016-10-28 ENCOUNTER — Telehealth: Payer: Self-pay | Admitting: *Deleted

## 2016-10-28 NOTE — Telephone Encounter (Signed)
Called and communicated with pt abt mammogram results. Results were normal,no evidence of malignancy. Pt was pleased results. We also talked about her upcoming bone density ordered for Sept at [facility]. I told pt if she doesn't hear from them around that time,to call to make appt. Pt verbalized understanding. No further concerns. Message to be routed to [employer].

## 2016-10-31 DIAGNOSIS — E782 Mixed hyperlipidemia: Secondary | ICD-10-CM | POA: Diagnosis not present

## 2016-10-31 DIAGNOSIS — K219 Gastro-esophageal reflux disease without esophagitis: Secondary | ICD-10-CM | POA: Diagnosis not present

## 2016-10-31 DIAGNOSIS — L309 Dermatitis, unspecified: Secondary | ICD-10-CM | POA: Diagnosis not present

## 2016-10-31 DIAGNOSIS — R7301 Impaired fasting glucose: Secondary | ICD-10-CM | POA: Diagnosis not present

## 2016-10-31 DIAGNOSIS — I10 Essential (primary) hypertension: Secondary | ICD-10-CM | POA: Diagnosis not present

## 2016-10-31 DIAGNOSIS — Z0000 Encounter for general adult medical examination without abnormal findings: Secondary | ICD-10-CM | POA: Diagnosis not present

## 2016-10-31 DIAGNOSIS — C50919 Malignant neoplasm of unspecified site of unspecified female breast: Secondary | ICD-10-CM | POA: Diagnosis not present

## 2016-10-31 DIAGNOSIS — K589 Irritable bowel syndrome without diarrhea: Secondary | ICD-10-CM | POA: Diagnosis not present

## 2016-10-31 DIAGNOSIS — Z1159 Encounter for screening for other viral diseases: Secondary | ICD-10-CM | POA: Diagnosis not present

## 2016-10-31 DIAGNOSIS — E669 Obesity, unspecified: Secondary | ICD-10-CM | POA: Diagnosis not present

## 2016-10-31 DIAGNOSIS — M1990 Unspecified osteoarthritis, unspecified site: Secondary | ICD-10-CM | POA: Diagnosis not present

## 2016-12-12 DIAGNOSIS — H43392 Other vitreous opacities, left eye: Secondary | ICD-10-CM | POA: Diagnosis not present

## 2016-12-12 DIAGNOSIS — H43812 Vitreous degeneration, left eye: Secondary | ICD-10-CM | POA: Diagnosis not present

## 2017-01-01 DIAGNOSIS — H43812 Vitreous degeneration, left eye: Secondary | ICD-10-CM | POA: Diagnosis not present

## 2017-01-01 DIAGNOSIS — H4322 Crystalline deposits in vitreous body, left eye: Secondary | ICD-10-CM | POA: Diagnosis not present

## 2017-01-01 DIAGNOSIS — H33322 Round hole, left eye: Secondary | ICD-10-CM | POA: Diagnosis not present

## 2017-01-01 DIAGNOSIS — H43392 Other vitreous opacities, left eye: Secondary | ICD-10-CM | POA: Diagnosis not present

## 2017-01-08 DIAGNOSIS — Z124 Encounter for screening for malignant neoplasm of cervix: Secondary | ICD-10-CM | POA: Diagnosis not present

## 2017-01-08 DIAGNOSIS — Z6830 Body mass index (BMI) 30.0-30.9, adult: Secondary | ICD-10-CM | POA: Diagnosis not present

## 2017-01-26 ENCOUNTER — Other Ambulatory Visit: Payer: Self-pay | Admitting: Adult Health

## 2017-01-26 DIAGNOSIS — C50011 Malignant neoplasm of nipple and areola, right female breast: Secondary | ICD-10-CM

## 2017-01-27 NOTE — Telephone Encounter (Signed)
Mendel Ryder,   Would you refill if appropriate?   Thanks! Mike Craze, NP [place] [phone]

## 2017-01-29 DIAGNOSIS — H33322 Round hole, left eye: Secondary | ICD-10-CM | POA: Diagnosis not present

## 2017-01-29 DIAGNOSIS — H43392 Other vitreous opacities, left eye: Secondary | ICD-10-CM | POA: Diagnosis not present

## 2017-01-29 DIAGNOSIS — H43812 Vitreous degeneration, left eye: Secondary | ICD-10-CM | POA: Diagnosis not present

## 2017-01-29 DIAGNOSIS — H4322 Crystalline deposits in vitreous body, left eye: Secondary | ICD-10-CM | POA: Diagnosis not present

## 2017-02-13 ENCOUNTER — Other Ambulatory Visit: Payer: Self-pay | Admitting: *Deleted

## 2017-02-24 ENCOUNTER — Other Ambulatory Visit: Payer: Self-pay | Admitting: Adult Health

## 2017-02-24 DIAGNOSIS — C50011 Malignant neoplasm of nipple and areola, right female breast: Secondary | ICD-10-CM

## 2017-03-24 ENCOUNTER — Other Ambulatory Visit: Payer: Self-pay | Admitting: Adult Health

## 2017-03-24 DIAGNOSIS — C50011 Malignant neoplasm of nipple and areola, right female breast: Secondary | ICD-10-CM

## 2017-04-07 DIAGNOSIS — Z853 Personal history of malignant neoplasm of breast: Secondary | ICD-10-CM | POA: Diagnosis not present

## 2017-04-07 DIAGNOSIS — D4989 Neoplasm of unspecified behavior of other specified sites: Secondary | ICD-10-CM | POA: Diagnosis not present

## 2017-04-08 DIAGNOSIS — N958 Other specified menopausal and perimenopausal disorders: Secondary | ICD-10-CM | POA: Diagnosis not present

## 2017-04-22 ENCOUNTER — Other Ambulatory Visit: Payer: Self-pay | Admitting: Adult Health

## 2017-04-22 DIAGNOSIS — C50011 Malignant neoplasm of nipple and areola, right female breast: Secondary | ICD-10-CM

## 2017-05-14 ENCOUNTER — Other Ambulatory Visit: Payer: Self-pay | Admitting: General Surgery

## 2017-05-14 DIAGNOSIS — D235 Other benign neoplasm of skin of trunk: Secondary | ICD-10-CM | POA: Diagnosis not present

## 2017-05-14 DIAGNOSIS — Z853 Personal history of malignant neoplasm of breast: Secondary | ICD-10-CM | POA: Diagnosis not present

## 2017-05-14 DIAGNOSIS — L821 Other seborrheic keratosis: Secondary | ICD-10-CM | POA: Diagnosis not present

## 2017-05-20 ENCOUNTER — Other Ambulatory Visit: Payer: Self-pay | Admitting: Adult Health

## 2017-05-20 DIAGNOSIS — C50011 Malignant neoplasm of nipple and areola, right female breast: Secondary | ICD-10-CM

## 2017-06-18 ENCOUNTER — Other Ambulatory Visit: Payer: Self-pay | Admitting: Adult Health

## 2017-06-18 DIAGNOSIS — C50011 Malignant neoplasm of nipple and areola, right female breast: Secondary | ICD-10-CM

## 2017-07-13 ENCOUNTER — Other Ambulatory Visit: Payer: Self-pay | Admitting: Adult Health

## 2017-07-13 DIAGNOSIS — C50011 Malignant neoplasm of nipple and areola, right female breast: Secondary | ICD-10-CM

## 2017-07-21 DIAGNOSIS — H5202 Hypermetropia, left eye: Secondary | ICD-10-CM | POA: Diagnosis not present

## 2017-07-21 DIAGNOSIS — H53001 Unspecified amblyopia, right eye: Secondary | ICD-10-CM | POA: Diagnosis not present

## 2017-07-21 DIAGNOSIS — H25812 Combined forms of age-related cataract, left eye: Secondary | ICD-10-CM | POA: Diagnosis not present

## 2017-07-21 DIAGNOSIS — H52222 Regular astigmatism, left eye: Secondary | ICD-10-CM | POA: Diagnosis not present

## 2017-08-06 ENCOUNTER — Ambulatory Visit: Payer: Medicare HMO | Admitting: Oncology

## 2017-08-06 ENCOUNTER — Other Ambulatory Visit: Payer: Medicare HMO

## 2017-08-11 ENCOUNTER — Other Ambulatory Visit: Payer: Self-pay | Admitting: Adult Health

## 2017-08-11 DIAGNOSIS — C50011 Malignant neoplasm of nipple and areola, right female breast: Secondary | ICD-10-CM

## 2017-08-18 NOTE — Progress Notes (Signed)
[name]  [phone] [phone]     ID: Savannah Jones   DOB: Oct 05, 1945  MR#: [id number]  IPJ#:[id number]   PCP:  Mayra Neer, MD GYN:  Dian Queen, MD SU:  Autumn Messing, MD Other:  Ronald Lobo, MD, Gaynelle Arabian MD  CHIEF COMPLAINT: Estrogen receptor positive breast cancer  CURRENT TREATMENT: Completing 7 years of letrozole letrozole  HISTORY OF PRESENT ILLNESS: From the original intake note:  The patient had an abnormal screening mammogram in April 2011 and [facility] was [facility] by Dr. Helane Rima for further evaluation.  On May 3, she had right diagnostic mammography and right breast ultrasound by Dr. Ardeen Garland and additional views showed a well-defined density in the right upper outer quadrant which was not palpable.  Ultrasound showed mixed echogenicity area measuring approximately 1.6 cm, correlating well with the mammogram.  There were no cysts and ultrasound of the right axilla was unremarkable.  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  With this information the patient was referred to Dr. Marlou Starks and on Sep 20, 2009, bilateral breast MRIs were obtained.  In the right breast, there was an irregular spiculated mass measuring 3.2 cm which was the biopsy proven cancer. There was a separate 1.6 cm area of mass like enhancement more anterior to the prior mass, but without a bridging area of enhancement.  If both masses are taking the intervening non-enhancing parenchyma, the total measurement was 7.5 cm.  The left breast was unremarkable.  Accordingly, the patient was recalled for repeat right ultrasonography on May 13, but the second mass noted by MRI could not  be located by ultrasound.  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  E-cadherin stain shows weak membranous staining.  With this information, the patient is referred for further evaluation and treatment.  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  The two areas in question accordingly may be unrelated.  Her subsequent history is detailed below  INTERVAL HISTORY: Savannah Jones follow-up of her estrogen receptor positive breast cancer. She is accompanied by her husband.  Continues on letrozole, generally with very good tolerance.  She has knee problems but these are likely not related.  Hot flashes and vaginal dryness or not a major issue.  REVIEW OF SYSTEMS: [name] is enjoying the spring. She has been having some issues with her left knee and is unsure if it is arthritis. She adds feeling a tingly sensation in her knee and muscles. She enjoys walking, but because of her knee pain she is unable to walk or exercise much. She is a member of [employer], but she does not attend much.  She is very concerned because she has been consciously trying to lose weight and does not want to have the problem "going the opposite direction".  She denies unusual headaches, visual changes, nausea, vomiting, or dizziness. There has been no unusual cough, phlegm production, or pleurisy. This been no change in bowel  or bladder habits. She denies unexplained fatigue or unexplained weight loss, bleeding, rash, or fever. A detailed review of systems was otherwise noncontributory.    PAST MEDICAL HISTORY: Past Medical History:  Diagnosis Date  . Arthritis   . Hyperlipidemia   . IBS (irritable bowel syndrome)   . Personal history of  chemotherapy 2011  Significant for osteoarthritis, hypertension, history of interstitial cystitis treated most recently by Dr. Lawrence Santiago, but not requiring any intervention currently.  She did have some irrigation treatments in the past and she has chronic microscopic hematuria as a result of that.  She is blind in her right eye and has had multiple eye surgeries in the past because of a forceps injury at birth initially for cataracts, later for "lazy eye."  All that was done in [place] remotely.  She is status post tonsillectomy, status post laparoscopic "surgery" looking for endometriosis to try to explain her difficulty getting pregnant, but she did not have endometriosis.  She has hypercholesterolemia, GERD which she describes as mild, history of irritable bowel syndrome associated with stress, history of squamous cell skin cancers removed by Dr. Ronnald Ramp.  She has a history of tobacco abuse of a little less than 10 pack years.  PAST SURGICAL HISTORY: Past Surgical History:  Procedure Laterality Date  . BREAST BIOPSY Right 2011   x2  . BREAST SURGERY    . MASTECTOMY  03/05/10   negative sentinel bx, T1c no right breast cancer    FAMILY HISTORY The patient's father died at the age of 14 from a subarachnoid hemorrhage.  The patient's mother died at the age of 17 from complications of diabetes.  The patient is an only child.  GYNECOLOGIC HISTORY: She is Gx, P2, first pregnancy to term age 36.  She went through the change of life around age 15 and she never took hormones.  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 She knew [name] and [name], that I work with briefly. Her husband Joneen Boers, is a retired Radio producer.  He used to Chief of Staff.  Son Savannah Jones works for [employer] in [place].  Daughter Savannah Jones  plans to be a Scientist, water quality.  The patient has 1 grandson. She attends a [employer].    ADVANCED DIRECTIVES:In place  HEALTH  MAINTENANCE: Social History   Tobacco Use  . Smoking status: Former Research scientist (life sciences)  . Smokeless tobacco: Former Systems developer    Quit date: 02/26/1974  Substance Use Topics  . Alcohol use: No  . Drug use: No     Colonoscopy:  May 2014, Dr. Cristina Gong  PAP:  July 2014, Dr. Helane Rima  Bone density: 02/08/2013/[facility]/normal  Lipid panel:  Dr. Mayra Neer  Allergies  Allergen Reactions  . Sulfa Antibiotics     Obtain reaction/severity from patient.    Current Outpatient Medications  Medication Sig Dispense Refill  . cholecalciferol (VITAMIN D) 1000 UNITS tablet Take 1 tablet (1,000 Units total) by mouth daily.    Marland Kitchen letrozole (FEMARA) 2.5 MG tablet TAKE 1 TABLET ONCE DAILY. 30 tablet 11  . lovastatin (MEVACOR) 40 MG tablet     . pyridOXINE (VITAMIN B-6) 50 MG tablet Take 100 mg by mouth daily.    . ramipril (ALTACE) 10 MG capsule Take 10 mg by mouth daily.      . vitamin B-12 (CYANOCOBALAMIN) 250 MCG tablet Take 500 mcg by mouth daily.    . vitamin C (ASCORBIC ACID) 500 MG tablet Take 100 mg by mouth  daily.     No current facility-administered medications for this visit.     OBJECTIVE: middle-aged white woman in no acute distress  Vitals:   08/19/17 1420  BP: (!) 149/85  Pulse: 83  Resp: 18  Temp: 98.4 F (36.9 C)  SpO2: 96%     Body mass index is 28.92 kg/m.    ECOG FS: 1 Filed Weights   08/19/17 1420  Weight: 168 lb 8 oz (76.4 kg)  Was 210 pounds and 1014, 177 pounds a year ago  Sclerae unicteric, pupils round and equal Oropharynx clear and moist No cervical or supraclavicular adenopathy Lungs no rales or rhonchi Heart regular rate and rhythm Abd soft, nontender, positive bowel sounds MSK no focal spinal tenderness, no upper extremity lymphedema Neuro: nonfocal, well oriented, appropriate affect Breasts: Status post right mastectomy with no evidence of chest wall recurrence.  Left breast is benign.  Both axillae are benign.  LAB RESULTS: Lab Results  Component Value Date    WBC 7.6 08/19/2017   NEUTROABS 4.7 08/19/2017   HGB 14.2 08/19/2017   HCT 42.6 08/19/2017   MCV 92.3 08/19/2017   PLT 263 08/19/2017      Chemistry      Component Value Date/Time   NA 143 08/06/2016 0913   K 3.5 08/06/2016 0913   CL 100 06/08/2012 1101   CO2 23 08/06/2016 0913   BUN 12.1 08/06/2016 0913   CREATININE 0.8 08/06/2016 0913      Component Value Date/Time   CALCIUM 9.8 08/06/2016 0913   ALKPHOS 90 08/06/2016 0913   AST 26 08/06/2016 0913   ALT 27 08/06/2016 0913   BILITOT 0.51 08/06/2016 0913        STUDIES: We reviewed her DEXA scan results from November 2018 which remain normal  ASSESSMENT: 72 y.o.  [place] woman   (1) status post right breast biopsy May of 2011 for an invasive ductal carcinoma, estrogen receptor positive at 100%, but progesterone receptor negative at 0%, with a low proliferation fraction at 11% and an equivocal result by CISH for HER2/neu amplification, with a ratio of 2.04.  (2)  treated neoadjuvantly with 5 cycles of docetaxel/ carboplatin/ trastuzumab, completed October of 2011, the 6th cycle held because of neuropathy,   (3) status post right mastectomy and sentinel lymph node sampling October of 2011 for what proved to be a ypT1c ypN0, strongly ER positive, weakly PR positive, clearly HER2 negative invasive ductal carcinoma with a low proliferation fraction.  She did not require postmastectomy radiation.   (4) on letrozole January to October of 2012, discontinued because of arthralgias, definitively resumed May 2013.  Plan is to continue to July 2022  (a) Bone density 02/08/2013 was normal  (b) DEXA scan 04/08/2017 was normal with a T score of -0.6  (c) completing 7 years of letrozole October 27, 2017  PLAN: Kennyth Lose is now 7-1/2 years out from definitive surgery for her breast cancer with no evidence of disease recurrence.  This is very favorable.  She understands that the current data suggest that 7 years of letrozole is optimal and  there is no advantage to continuing beyond that point.  She has figured out 7 years to the day, and the date is going to be October 27, 2017.  At that point she will discontinue letrozole and "get out of the cancer business".  Accordingly I am not making any further routine appointments for her here.  We did discuss our survivorship program but at this point she  is not interested in participating.  All she will need in terms of breast cancer follow-up is a yearly physician breast and chest wall exam and yearly mammography  I will be glad to see her again at any point in the future if and when the need arises but as of now are making no further routine appointments for her here.  Rajesh Wyss, Virgie Dad, MD  08/19/17 2:56 PM Medical Oncology and Hematology [facility] [address] [place], [place] [address] Tel. [phone]    Fax. [phone]  This document serves as a record of services personally performed by Chauncey Cruel, MD. It was created on his behalf by Margit Banda, a trained medical scribe. The creation of this record is based on the scribe's personal observations and the provider's statements to them.   I have reviewed the above documentation for accuracy and completeness, and I agree with the above.

## 2017-08-19 ENCOUNTER — Inpatient Hospital Stay: Payer: Medicare HMO | Attending: Oncology | Admitting: Oncology

## 2017-08-19 ENCOUNTER — Inpatient Hospital Stay: Payer: Medicare HMO

## 2017-08-19 VITALS — BP 149/85 | HR 83 | Temp 98.4°F | Resp 18 | Ht 64.0 in | Wt 168.5 lb

## 2017-08-19 DIAGNOSIS — C50911 Malignant neoplasm of unspecified site of right female breast: Secondary | ICD-10-CM | POA: Diagnosis not present

## 2017-08-19 DIAGNOSIS — Z79811 Long term (current) use of aromatase inhibitors: Secondary | ICD-10-CM

## 2017-08-19 DIAGNOSIS — Z170 Estrogen receptor positive status [ER+]: Secondary | ICD-10-CM

## 2017-08-19 DIAGNOSIS — C50411 Malignant neoplasm of upper-outer quadrant of right female breast: Secondary | ICD-10-CM

## 2017-08-19 DIAGNOSIS — C50011 Malignant neoplasm of nipple and areola, right female breast: Secondary | ICD-10-CM

## 2017-08-19 LAB — CBC WITH DIFFERENTIAL/PLATELET
BASOS PCT: 1 %
Basophils Absolute: 0.1 10*3/uL (ref 0.0–0.1)
Eosinophils Absolute: 0.1 10*3/uL (ref 0.0–0.5)
Eosinophils Relative: 1 %
HEMATOCRIT: 42.6 % (ref 34.8–46.6)
HEMOGLOBIN: 14.2 g/dL (ref 11.6–15.9)
LYMPHS PCT: 28 %
Lymphs Abs: 2.1 10*3/uL (ref 0.9–3.3)
MCH: 30.7 pg (ref 25.1–34.0)
MCHC: 33.3 g/dL (ref 31.5–36.0)
MCV: 92.3 fL (ref 79.5–101.0)
Monocytes Absolute: 0.6 10*3/uL (ref 0.1–0.9)
Monocytes Relative: 8 %
NEUTROS ABS: 4.7 10*3/uL (ref 1.5–6.5)
NEUTROS PCT: 62 %
Platelets: 263 10*3/uL (ref 145–400)
RBC: 4.62 MIL/uL (ref 3.70–5.45)
RDW: 13.1 % (ref 11.2–14.5)
WBC: 7.6 10*3/uL (ref 3.9–10.3)

## 2017-08-19 LAB — COMPREHENSIVE METABOLIC PANEL
ALBUMIN: 4.1 g/dL (ref 3.5–5.0)
ALK PHOS: 87 U/L (ref 40–150)
ALT: 20 U/L (ref 0–55)
AST: 22 U/L (ref 5–34)
Anion gap: 9 (ref 3–11)
BILIRUBIN TOTAL: 0.5 mg/dL (ref 0.2–1.2)
BUN: 11 mg/dL (ref 7–26)
CALCIUM: 9.6 mg/dL (ref 8.4–10.4)
CO2: 26 mmol/L (ref 22–29)
Chloride: 105 mmol/L (ref 98–109)
Creatinine, Ser: 0.89 mg/dL (ref 0.60–1.10)
GFR calc Af Amer: >60 mL/min (ref >60)
GLUCOSE: 106 mg/dL (ref 70–140)
Potassium: 3.9 mmol/L (ref 3.5–5.1)
Sodium: 140 mmol/L (ref 136–145)
TOTAL PROTEIN: 7.6 g/dL (ref 6.4–8.3)

## 2017-08-20 ENCOUNTER — Telehealth: Payer: Self-pay | Admitting: Oncology

## 2017-08-20 NOTE — Telephone Encounter (Signed)
Per 4/10 no los

## 2017-09-17 ENCOUNTER — Other Ambulatory Visit: Payer: Self-pay | Admitting: Adult Health

## 2017-09-17 DIAGNOSIS — Z1231 Encounter for screening mammogram for malignant neoplasm of breast: Secondary | ICD-10-CM

## 2017-11-02 ENCOUNTER — Ambulatory Visit
Admission: RE | Admit: 2017-11-02 | Discharge: 2017-11-02 | Disposition: A | Discharge status: Home / Self Care | Payer: Medicare HMO | Source: Ambulatory Visit | Attending: Adult Health | Admitting: Adult Health

## 2017-11-02 DIAGNOSIS — Z1231 Encounter for screening mammogram for malignant neoplasm of breast: Secondary | ICD-10-CM

## 2017-12-03 DIAGNOSIS — L309 Dermatitis, unspecified: Secondary | ICD-10-CM | POA: Diagnosis not present

## 2017-12-03 DIAGNOSIS — M1990 Unspecified osteoarthritis, unspecified site: Secondary | ICD-10-CM | POA: Diagnosis not present

## 2017-12-03 DIAGNOSIS — Z853 Personal history of malignant neoplasm of breast: Secondary | ICD-10-CM | POA: Diagnosis not present

## 2017-12-03 DIAGNOSIS — Z0000 Encounter for general adult medical examination without abnormal findings: Secondary | ICD-10-CM | POA: Diagnosis not present

## 2017-12-03 DIAGNOSIS — K589 Irritable bowel syndrome without diarrhea: Secondary | ICD-10-CM | POA: Diagnosis not present

## 2017-12-03 DIAGNOSIS — E782 Mixed hyperlipidemia: Secondary | ICD-10-CM | POA: Diagnosis not present

## 2017-12-03 DIAGNOSIS — R7301 Impaired fasting glucose: Secondary | ICD-10-CM | POA: Diagnosis not present

## 2017-12-03 DIAGNOSIS — K219 Gastro-esophageal reflux disease without esophagitis: Secondary | ICD-10-CM | POA: Diagnosis not present

## 2017-12-03 DIAGNOSIS — I10 Essential (primary) hypertension: Secondary | ICD-10-CM | POA: Diagnosis not present

## 2018-01-19 DIAGNOSIS — Z6829 Body mass index (BMI) 29.0-29.9, adult: Secondary | ICD-10-CM | POA: Diagnosis not present

## 2018-01-19 DIAGNOSIS — Z01419 Encounter for gynecological examination (general) (routine) without abnormal findings: Secondary | ICD-10-CM | POA: Diagnosis not present

## 2018-06-02 DIAGNOSIS — C50911 Malignant neoplasm of unspecified site of right female breast: Secondary | ICD-10-CM | POA: Diagnosis not present

## 2018-06-03 DIAGNOSIS — E782 Mixed hyperlipidemia: Secondary | ICD-10-CM | POA: Diagnosis not present

## 2018-06-03 DIAGNOSIS — C50911 Malignant neoplasm of unspecified site of right female breast: Secondary | ICD-10-CM | POA: Diagnosis not present

## 2018-09-24 ENCOUNTER — Other Ambulatory Visit: Payer: Self-pay | Admitting: Family Medicine

## 2018-09-24 DIAGNOSIS — Z1231 Encounter for screening mammogram for malignant neoplasm of breast: Secondary | ICD-10-CM

## 2018-11-15 ENCOUNTER — Other Ambulatory Visit: Payer: Self-pay

## 2018-11-15 ENCOUNTER — Ambulatory Visit
Admission: RE | Admit: 2018-11-15 | Discharge: 2018-11-15 | Disposition: A | Discharge status: Home / Self Care | Payer: Medicare HMO | Source: Ambulatory Visit | Attending: Family Medicine | Admitting: Family Medicine

## 2018-11-15 DIAGNOSIS — Z1231 Encounter for screening mammogram for malignant neoplasm of breast: Secondary | ICD-10-CM | POA: Diagnosis not present

## 2019-01-21 DIAGNOSIS — Z124 Encounter for screening for malignant neoplasm of cervix: Secondary | ICD-10-CM | POA: Diagnosis not present

## 2019-01-21 DIAGNOSIS — Z760 Encounter for issue of repeat prescription: Secondary | ICD-10-CM | POA: Diagnosis not present

## 2019-01-21 DIAGNOSIS — Z6828 Body mass index (BMI) 28.0-28.9, adult: Secondary | ICD-10-CM | POA: Diagnosis not present

## 2019-01-21 DIAGNOSIS — Z01419 Encounter for gynecological examination (general) (routine) without abnormal findings: Secondary | ICD-10-CM | POA: Diagnosis not present

## 2019-02-10 DIAGNOSIS — I10 Essential (primary) hypertension: Secondary | ICD-10-CM | POA: Diagnosis not present

## 2019-02-10 DIAGNOSIS — E782 Mixed hyperlipidemia: Secondary | ICD-10-CM | POA: Diagnosis not present

## 2019-02-10 DIAGNOSIS — R7301 Impaired fasting glucose: Secondary | ICD-10-CM | POA: Diagnosis not present

## 2019-02-14 DIAGNOSIS — Z0000 Encounter for general adult medical examination without abnormal findings: Secondary | ICD-10-CM | POA: Diagnosis not present

## 2019-02-14 DIAGNOSIS — K589 Irritable bowel syndrome without diarrhea: Secondary | ICD-10-CM | POA: Diagnosis not present

## 2019-02-14 DIAGNOSIS — E782 Mixed hyperlipidemia: Secondary | ICD-10-CM | POA: Diagnosis not present

## 2019-02-14 DIAGNOSIS — Z853 Personal history of malignant neoplasm of breast: Secondary | ICD-10-CM | POA: Diagnosis not present

## 2019-02-14 DIAGNOSIS — I10 Essential (primary) hypertension: Secondary | ICD-10-CM | POA: Diagnosis not present

## 2019-02-14 DIAGNOSIS — M1990 Unspecified osteoarthritis, unspecified site: Secondary | ICD-10-CM | POA: Diagnosis not present

## 2019-02-14 DIAGNOSIS — R7301 Impaired fasting glucose: Secondary | ICD-10-CM | POA: Diagnosis not present

## 2019-02-14 DIAGNOSIS — Z7189 Other specified counseling: Secondary | ICD-10-CM | POA: Diagnosis not present

## 2019-02-14 DIAGNOSIS — K219 Gastro-esophageal reflux disease without esophagitis: Secondary | ICD-10-CM | POA: Diagnosis not present

## 2019-02-14 DIAGNOSIS — L309 Dermatitis, unspecified: Secondary | ICD-10-CM | POA: Diagnosis not present

## 2019-09-03 ENCOUNTER — Ambulatory Visit: Payer: Medicare HMO | Attending: Internal Medicine

## 2019-09-03 DIAGNOSIS — Z23 Encounter for immunization: Secondary | ICD-10-CM

## 2019-09-03 NOTE — Progress Notes (Signed)
   Covid-19 Vaccination Clinic  Name:  Savannah Jones    MRN: [id number] DOB: 06/06/1945  09/03/2019  [name] was observed post Covid-19 immunization for 15 minutes without incident. She was provided with Vaccine Information Sheet and instruction to access the V-Safe system.   [name] was instructed to call 911 with any severe reactions post vaccine: Marland Kitchen Difficulty breathing  . Swelling of face and throat  . A fast heartbeat  . A bad rash all over body  . Dizziness and weakness   Immunizations Administered    Name Date Dose VIS Date Route   Pfizer COVID-19 Vaccine 09/03/2019 10:53 AM 0.3 mL 07/06/2018 Intramuscular   Manufacturer: [place]   Lot: [id number]   [place]: [id number]

## 2019-09-26 ENCOUNTER — Ambulatory Visit: Payer: Medicare HMO | Attending: Internal Medicine

## 2019-09-26 DIAGNOSIS — Z23 Encounter for immunization: Secondary | ICD-10-CM

## 2019-09-26 NOTE — Progress Notes (Signed)
   Covid-19 Vaccination Clinic  Name:  [name]    MRN: [id number] DOB: 1945-10-14  09/26/2019  [name] was observed post Covid-19 immunization for 15 minutes without incident. She was provided with Vaccine Information Sheet and instruction to access the V-Safe system.   [name] was instructed to call 911 with any severe reactions post vaccine: Marland Kitchen Difficulty breathing  . Swelling of face and throat  . A fast heartbeat  . A bad rash all over body  . Dizziness and weakness   Immunizations Administered    Name Date Dose VIS Date Route   Pfizer COVID-19 Vaccine 09/26/2019  9:27 AM 0.3 mL 07/06/2018 Intramuscular   Manufacturer: [place]   Lot: [id number]   [place]: [id number]

## 2019-11-11 ENCOUNTER — Other Ambulatory Visit: Payer: Self-pay | Admitting: Family Medicine

## 2019-11-11 DIAGNOSIS — Z1231 Encounter for screening mammogram for malignant neoplasm of breast: Secondary | ICD-10-CM

## 2019-11-28 ENCOUNTER — Other Ambulatory Visit: Payer: Self-pay

## 2019-11-28 ENCOUNTER — Ambulatory Visit
Admission: RE | Admit: 2019-11-28 | Discharge: 2019-11-28 | Disposition: A | Discharge status: Home / Self Care | Payer: Medicare HMO | Source: Ambulatory Visit | Attending: Family Medicine | Admitting: Family Medicine

## 2019-11-28 DIAGNOSIS — Z1231 Encounter for screening mammogram for malignant neoplasm of breast: Secondary | ICD-10-CM

## 2020-11-09 ENCOUNTER — Other Ambulatory Visit: Payer: Self-pay | Admitting: Obstetrics and Gynecology

## 2020-11-09 DIAGNOSIS — Z1231 Encounter for screening mammogram for malignant neoplasm of breast: Secondary | ICD-10-CM

## 2020-11-09 DIAGNOSIS — Z9011 Acquired absence of right breast and nipple: Secondary | ICD-10-CM

## 2021-01-07 ENCOUNTER — Ambulatory Visit
Admission: RE | Admit: 2021-01-07 | Discharge: 2021-01-07 | Disposition: A | Discharge status: Home / Self Care | Payer: Medicare HMO | Source: Ambulatory Visit | Attending: Obstetrics and Gynecology | Admitting: Obstetrics and Gynecology

## 2021-01-07 ENCOUNTER — Other Ambulatory Visit: Payer: Self-pay

## 2021-01-07 DIAGNOSIS — Z1231 Encounter for screening mammogram for malignant neoplasm of breast: Secondary | ICD-10-CM

## 2021-01-07 DIAGNOSIS — Z9011 Acquired absence of right breast and nipple: Secondary | ICD-10-CM

## 2021-01-08 ENCOUNTER — Emergency Department (HOSPITAL_BASED_OUTPATIENT_CLINIC_OR_DEPARTMENT_OTHER)
Admission: EM | Admit: 2021-01-08 | Discharge: 2021-01-08 | Disposition: A | Discharge status: Home / Self Care | Payer: Medicare HMO | Attending: Emergency Medicine | Admitting: Emergency Medicine

## 2021-01-08 ENCOUNTER — Emergency Department (HOSPITAL_BASED_OUTPATIENT_CLINIC_OR_DEPARTMENT_OTHER): Payer: Medicare HMO

## 2021-01-08 ENCOUNTER — Encounter (HOSPITAL_BASED_OUTPATIENT_CLINIC_OR_DEPARTMENT_OTHER): Payer: Self-pay | Admitting: *Deleted

## 2021-01-08 ENCOUNTER — Other Ambulatory Visit: Payer: Self-pay

## 2021-01-08 DIAGNOSIS — Z87891 Personal history of nicotine dependence: Secondary | ICD-10-CM | POA: Insufficient documentation

## 2021-01-08 DIAGNOSIS — Z853 Personal history of malignant neoplasm of breast: Secondary | ICD-10-CM | POA: Diagnosis not present

## 2021-01-08 DIAGNOSIS — R194 Change in bowel habit: Secondary | ICD-10-CM | POA: Diagnosis present

## 2021-01-08 DIAGNOSIS — K649 Unspecified hemorrhoids: Secondary | ICD-10-CM | POA: Insufficient documentation

## 2021-01-08 DIAGNOSIS — K5900 Constipation, unspecified: Secondary | ICD-10-CM

## 2021-01-08 HISTORY — DX: Malignant (primary) neoplasm, unspecified: C80.1

## 2021-01-08 LAB — CBC WITH DIFFERENTIAL/PLATELET
Abs Immature Granulocytes: 0.03 10*3/uL (ref 0.00–0.07)
Basophils Absolute: 0.1 10*3/uL (ref 0.0–0.1)
Basophils Relative: 1 %
Eosinophils Absolute: 0 10*3/uL (ref 0.0–0.5)
Eosinophils Relative: 1 %
HCT: 43.2 % (ref 36.0–46.0)
Hemoglobin: 14.3 g/dL (ref 12.0–15.0)
Immature Granulocytes: 0 %
Lymphocytes Relative: 24 %
Lymphs Abs: 1.8 10*3/uL (ref 0.7–4.0)
MCH: 30.6 pg (ref 26.0–34.0)
MCHC: 33.1 g/dL (ref 30.0–36.0)
MCV: 92.3 fL (ref 80.0–100.0)
Monocytes Absolute: 0.7 10*3/uL (ref 0.1–1.0)
Monocytes Relative: 9 %
Neutro Abs: 5 10*3/uL (ref 1.7–7.7)
Neutrophils Relative %: 65 %
Platelets: 264 10*3/uL (ref 150–400)
RBC: 4.68 MIL/uL (ref 3.87–5.11)
RDW: 12.5 % (ref 11.5–15.5)
WBC: 7.6 10*3/uL (ref 4.0–10.5)
nRBC: 0 % (ref 0.0–0.2)

## 2021-01-08 LAB — COMPREHENSIVE METABOLIC PANEL
ALT: 9 U/L (ref 0–44)
AST: 14 U/L — ABNORMAL LOW (ref 15–41)
Albumin: 4.1 g/dL (ref 3.5–5.0)
Alkaline Phosphatase: 52 U/L (ref 38–126)
Anion gap: 12 (ref 5–15)
BUN: 17 mg/dL (ref 8–23)
CO2: 25 mmol/L (ref 22–32)
Calcium: 9.3 mg/dL (ref 8.9–10.3)
Chloride: 103 mmol/L (ref 98–111)
Creatinine, Ser: 0.78 mg/dL (ref 0.44–1.00)
GFR, Estimated: >60 mL/min (ref >60)
Glucose, Bld: 105 mg/dL — ABNORMAL HIGH (ref 70–99)
Potassium: 3.3 mmol/L — ABNORMAL LOW (ref 3.5–5.1)
Sodium: 140 mmol/L (ref 135–145)
Total Bilirubin: 0.5 mg/dL (ref 0.3–1.2)
Total Protein: 7.3 g/dL (ref 6.5–8.1)

## 2021-01-08 MED ORDER — HYDROCORTISONE ACETATE 25 MG RE SUPP: 25.0000 mg | Freq: Two times a day (BID) | RECTAL | 1 refills | Status: AC

## 2021-01-08 MED ORDER — POLYETHYLENE GLYCOL 3350 17 G PO PACK
17.0000 g | PACK | Freq: Once | ORAL | Status: AC
  Administered 2021-01-08: 17 g via ORAL
  Filled 2021-01-08: qty 1

## 2021-01-08 MED ORDER — IOHEXOL 350 MG/ML SOLN
100.0000 mL | Freq: Once | INTRAVENOUS | Status: AC | PRN
  Administered 2021-01-08: 60 mL via INTRAVENOUS

## 2021-01-08 NOTE — ED Triage Notes (Signed)
Came in due to constipation. Last BM = a week ago.

## 2021-01-08 NOTE — ED Provider Notes (Signed)
[facility] EMERGENCY DEPT Provider Note   CSN: [id number] Arrival date & time: 01/08/21  1524     History Chief Complaint  Patient presents with   Constipation    Savannah Jones is a 75 y.o. female.  75 year old female with a history of IBS and breast cancer presenting with constipation.  Patient states that she has not had a BM in 1 week.  She has tried OTC stool softener (docusate/senna) without relief.  She feels the urge to defecate and is able to express stool when wiping but has not been able to have any stools.  She states she has also developed hemorrhoids over the past 2 to 3 days from straining and has been using OTC hemorrhoid cream.  She reports typically having BM every 1 to 2 days but does have frequent hard stools.  She states that she has been eating less over the past week and initially thought that was the reason she has not had a BM.  Has been passing flatus.  Denies abdominal pain, nausea, vomiting, blood in stool, rectal bleeding.  Denies any history of abdominal surgeries.  Denies chest pain, shortness of breath, headaches.  No recent medication changes.  Reports last colonoscopy which was a little over 10 years ago was normal.  The history is provided by the patient. No language interpreter was used.  Constipation     Past Medical History:  Diagnosis Date   Arthritis    Cancer ([place])    Hyperlipidemia    IBS (irritable bowel syndrome)    Personal history of chemotherapy 2011    Patient Active Problem List   Diagnosis Date Noted   Breast cancer of upper-outer quadrant of right female breast ([place]) 06/13/2013    Past Surgical History:  Procedure Laterality Date   BREAST BIOPSY Right 2011   x2   BREAST SURGERY     MASTECTOMY Right 03/05/2010   negative sentinel bx, T1c no right breast cancer     OB History     Gravida  2   Para  2   Term      Preterm      AB      Living         SAB      IAB      Ectopic       Multiple      Live Births              Family History  Problem Relation Age of Onset   Diabetes Mother    Hypertension Father    Breast cancer Neg Hx     Social History   Tobacco Use   Smoking status: Former   Smokeless tobacco: Former    Quit date: 02/26/1974  Vaping Use   Vaping Use: Never used  Substance Use Topics   Alcohol use: Yes    Comment: occasionally   Drug use: No    Home Medications Prior to Admission medications   Medication Sig Start Date End Date Taking? Authorizing Provider  hydrocortisone (ANUSOL-HC) 25 MG suppository Place 1 suppository (25 mg total) rectally every 12 (twelve) hours for 12 days. 01/08/21 01/20/21 Yes Zola Button, MD  ramipril (ALTACE) 10 MG capsule Take 10 mg by mouth daily.     Yes [provider]  vitamin C (ASCORBIC ACID) 500 MG tablet Take 100 mg by mouth daily. 07/12/14  Yes Magrinat, Virgie Dad, MD    Allergies    Sulfa antibiotics  Review of  Systems   Review of Systems  Respiratory:  Negative for shortness of breath.   Cardiovascular:  Negative for chest pain.  Gastrointestinal:  Positive for constipation.  Neurological:  Negative for headaches.   Physical Exam Updated Vital Signs BP (!) 180/98   Pulse 82   Temp 98.5 F (36.9 C)   Resp 17   Ht 5' 3.5" (1.613 m)   Wt 68.9 kg   SpO2 95%   BMI 26.50 kg/m   Physical Exam Vitals and nursing note reviewed. Exam conducted with a chaperone present.  Constitutional:      General: She is not in acute distress.    Appearance: She is well-developed.  HENT:     Head: Normocephalic and atraumatic.  Eyes:     Extraocular Movements: Extraocular movements intact.     Conjunctiva/sclera: Conjunctivae normal.  Cardiovascular:     Rate and Rhythm: Normal rate and regular rhythm.     Heart sounds: No murmur heard. Pulmonary:     Effort: Pulmonary effort is normal. No respiratory distress.     Breath sounds: Normal breath sounds.  Abdominal:     General: Bowel  sounds are normal.     Palpations: Abdomen is soft.     Tenderness: There is no abdominal tenderness.  Genitourinary:    Rectum: External hemorrhoid present.     Comments: External hemorrhoid noted at 9 o'clock position Musculoskeletal:     Cervical back: Neck supple.  Skin:    General: Skin is warm and dry.  Neurological:     Mental Status: She is alert.    ED Results / Procedures / Treatments   Labs (all labs ordered are listed, but only abnormal results are displayed) Labs Reviewed  COMPREHENSIVE METABOLIC PANEL - Abnormal; Notable for the following components:      Result Value   Potassium 3.3 (*)    Glucose, Bld 105 (*)    AST 14 (*)    All other components within normal limits  CBC WITH DIFFERENTIAL/PLATELET    EKG None  Radiology CT Abdomen Pelvis W Contrast  Result Date: 01/08/2021 CLINICAL DATA:  Concern for bowel obstruction. EXAM: CT ABDOMEN AND PELVIS WITH CONTRAST TECHNIQUE: Multidetector CT imaging of the abdomen and pelvis was performed using the standard protocol following bolus administration of intravenous contrast. CONTRAST:  73m OMNIPAQUE IOHEXOL 350 MG/ML SOLN COMPARISON:  None. FINDINGS: Lower chest: The visualized lung bases are clear. No intra-abdominal free air or free fluid. Hepatobiliary: No focal liver abnormality is seen. No gallstones, gallbladder wall thickening, or biliary dilatation. Pancreas: Unremarkable. No pancreatic ductal dilatation or surrounding inflammatory changes. Spleen: Normal in size without focal abnormality. Adrenals/Urinary Tract: The adrenal glands are unremarkable. There is a 2 mm nonobstructing left renal interpolar calculus. There is a 4.5 cm left renal upper pole cyst. There is no hydronephrosis on either side. There is symmetric enhancement and excretion of contrast by both kidneys. The visualized ureters and urinary bladder appear unremarkable. Stomach/Bowel: There is moderate stool throughout the colon. There is no bowel  obstruction or active inflammation. The appendix is normal. Vascular/Lymphatic: Mild aortoiliac atherosclerotic disease the IVC is no pain venous gas. There is no adenopathy. Reproductive: The uterus is grossly unremarkable. No adnexal masses. Other: None Musculoskeletal: Degenerative changes of the spine. No acute osseous pathology. IMPRESSION: 1. No acute intra-abdominal or pelvic pathology. No bowel obstruction. Normal appendix. 2. A 2 mm nonobstructing left renal interpolar calculus. No hydronephrosis. 3. Aortic Atherosclerosis (ICD10-I70.0). Electronically Signed   By: AMilas Hock  Radparvar M.D.   On: 01/08/2021 22:34    Procedures Procedures   Medications Ordered in ED Medications  polyethylene glycol (MIRALAX / GLYCOLAX) packet 17 g (17 g Oral Given 01/08/21 2142)  iohexol (OMNIPAQUE) 350 MG/ML injection 100 mL (60 mLs Intravenous Contrast Given 01/08/21 2212)    ED Course  I have reviewed the triage vital signs and the nursing notes.  Pertinent labs & imaging results that were available during my care of the patient were reviewed by me and considered in my medical decision making (see chart for details).    MDM Rules/Calculators/A&P                         75 year old female with a history of IBS presenting with constipation with no BM in 1 week.  No abdominal pain, nausea, vomiting.  Patient is significantly hypertensive to 220/112 but asymptomatic, otherwise VSS.  Abdominal exam unremarkable, external hemorrhoid noted on rectal exam.  Will obtain CT abdomen/pelvis to rule out obstructive process and will give dose of MiraLAX.  CT negative for acute pathology and without evidence of bowel obstruction.  Labs also unremarkable.  Stable for discharge at this time, recommended to take MiraLAX twice daily as needed and prescription given for Anusol.  Patient to follow-up with GI if not improving.  Final Clinical Impression(s) / ED Diagnoses Final diagnoses:  Constipation, unspecified  constipation type  Hemorrhoids, unspecified hemorrhoid type    Rx / DC Orders ED Discharge Orders          Ordered    hydrocortisone (ANUSOL-HC) 25 MG suppository  Every 12 hours        01/08/21 2321             Zola Button, MD 01/08/21 [id number]    Elnora Morrison, MD 01/09/21 0000

## 2021-01-08 NOTE — Discharge Instructions (Addendum)
Take Miralax twice a day as needed to achieve regular bowel movements. Take Anu-sol as needed for hemorrhoids. Follow-up with GI specialist if not improving.

## 2021-12-17 ENCOUNTER — Other Ambulatory Visit: Payer: Self-pay | Admitting: Family Medicine

## 2021-12-17 DIAGNOSIS — Z1231 Encounter for screening mammogram for malignant neoplasm of breast: Secondary | ICD-10-CM

## 2022-01-20 ENCOUNTER — Ambulatory Visit
Admission: RE | Admit: 2022-01-20 | Discharge: 2022-01-20 | Disposition: A | Discharge status: Home / Self Care | Payer: Medicare HMO | Source: Ambulatory Visit | Attending: Family Medicine | Admitting: Family Medicine

## 2022-01-20 DIAGNOSIS — Z1231 Encounter for screening mammogram for malignant neoplasm of breast: Secondary | ICD-10-CM

## 2022-01-23 ENCOUNTER — Other Ambulatory Visit: Payer: Self-pay | Admitting: Family Medicine

## 2022-01-23 DIAGNOSIS — R928 Other abnormal and inconclusive findings on diagnostic imaging of breast: Secondary | ICD-10-CM

## 2022-01-30 ENCOUNTER — Ambulatory Visit
Admission: RE | Admit: 2022-01-30 | Discharge: 2022-01-30 | Disposition: A | Discharge status: Home / Self Care | Payer: Medicare HMO | Source: Ambulatory Visit | Attending: Family Medicine | Admitting: Family Medicine

## 2022-01-30 ENCOUNTER — Ambulatory Visit: Payer: Medicare HMO

## 2022-01-30 DIAGNOSIS — R928 Other abnormal and inconclusive findings on diagnostic imaging of breast: Secondary | ICD-10-CM

## 2022-03-19 ENCOUNTER — Other Ambulatory Visit: Payer: Self-pay | Admitting: Family Medicine

## 2022-03-19 DIAGNOSIS — M810 Age-related osteoporosis without current pathological fracture: Secondary | ICD-10-CM

## 2022-03-21 ENCOUNTER — Ambulatory Visit
Admission: RE | Admit: 2022-03-21 | Discharge: 2022-03-21 | Disposition: A | Discharge status: Home / Self Care | Payer: Medicare HMO | Source: Ambulatory Visit | Attending: Family Medicine | Admitting: Family Medicine

## 2022-03-21 DIAGNOSIS — M810 Age-related osteoporosis without current pathological fracture: Secondary | ICD-10-CM

## 2022-10-10 IMAGING — CT CT ABD-PELV W/ CM
2 of 5 series · 17 of 46 positions shown, 19 images · IV contrast (APPLIED)
Comparison: None.

CLINICAL DATA: Concern for bowel obstruction.

EXAM:
CT ABDOMEN AND PELVIS WITH CONTRAST
TECHNIQUE: Multidetector CT imaging of the abdomen and pelvis was performed
using the standard protocol following bolus administration of
intravenous contrast.
CONTRAST:  60mL OMNIPAQUE IOHEXOL 350 MG/ML SOLN

[Series 2: abd pel w · axial · 0.78mm/px · z∈[+986,+1371]mm · 14 of 87 slices shown, 16 images]
[im 5/87  soft-tissue]
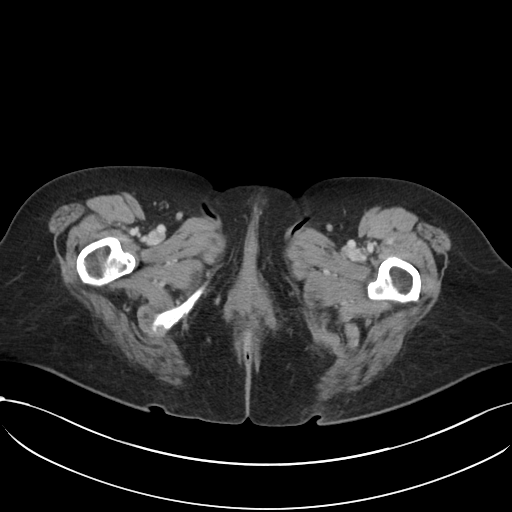
[im 5/87  bone]
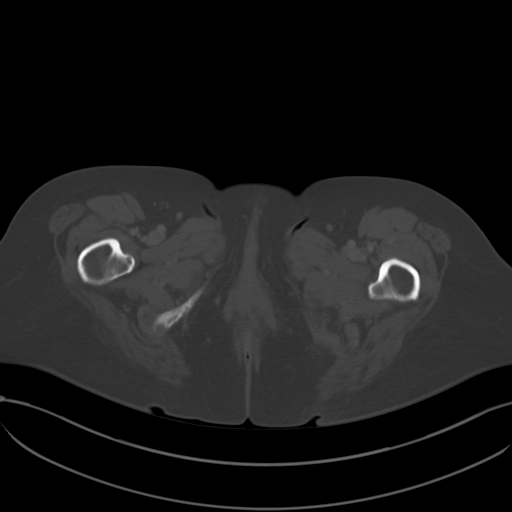
[im 10/87  soft-tissue]
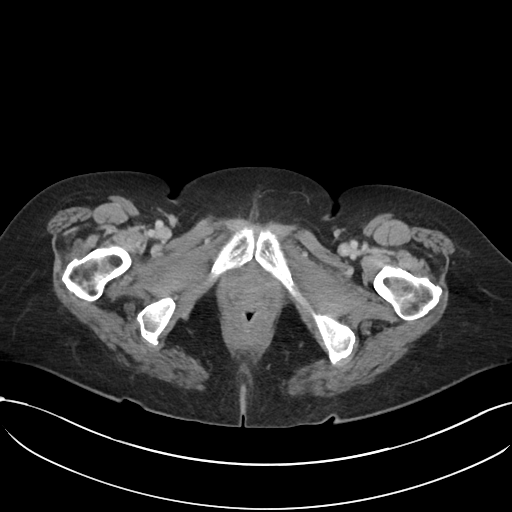
[im 20/87  soft-tissue]
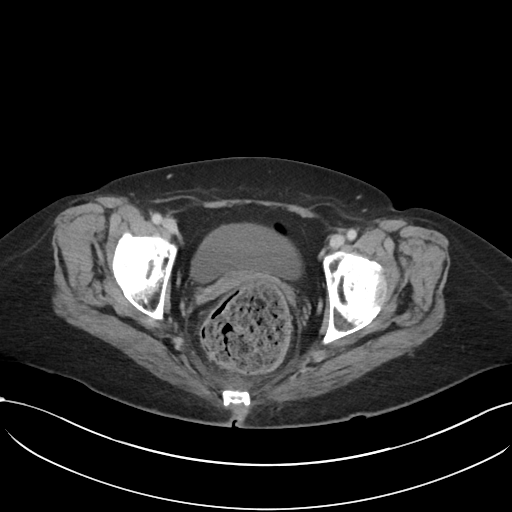
[im 24/87  soft-tissue]
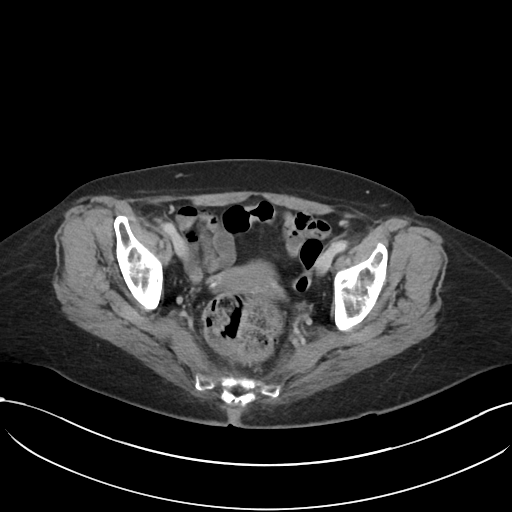
[im 29/87  soft-tissue]
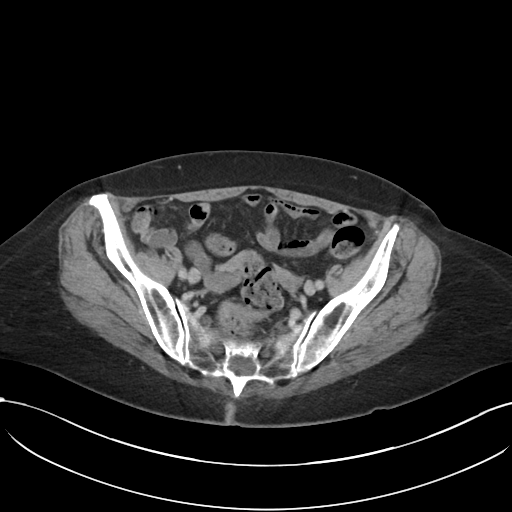
[im 34/87  soft-tissue]
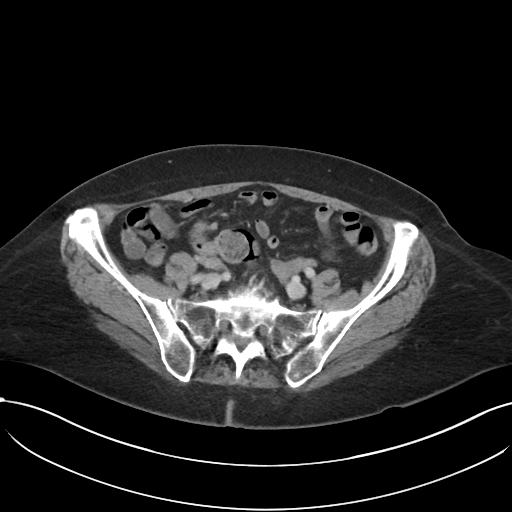
[im 39/87  soft-tissue]
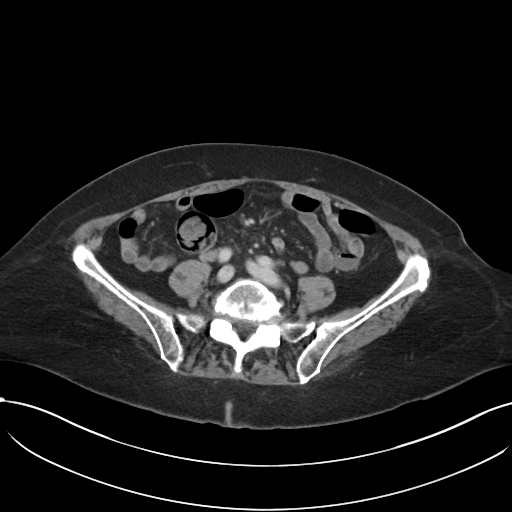
[im 48/87  soft-tissue]
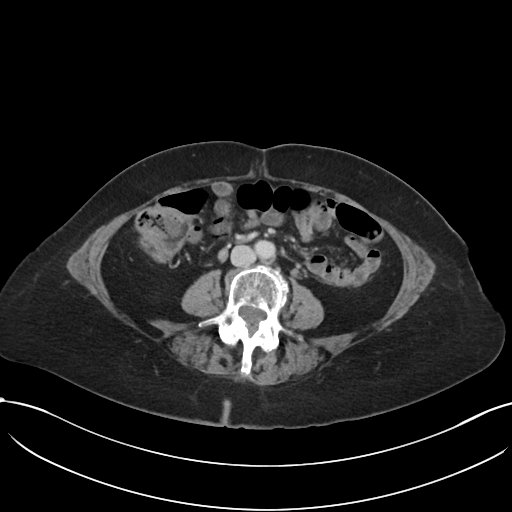
[im 53/87  soft-tissue]
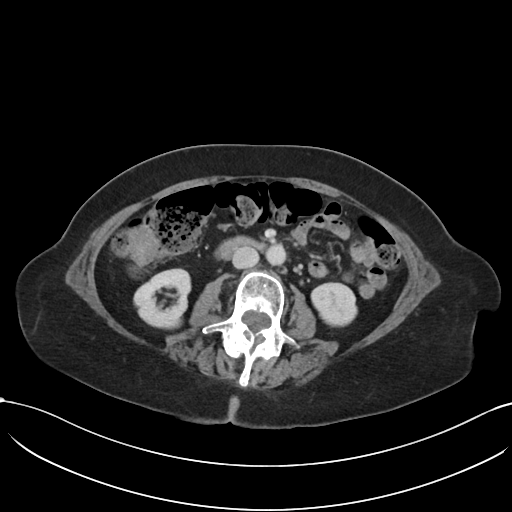
[im 53/87  bone]
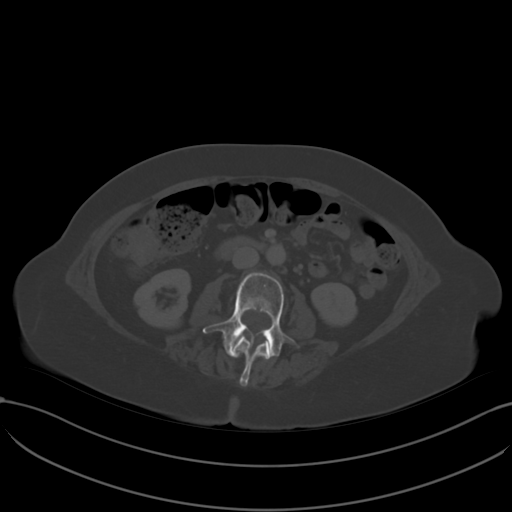
[im 58/87  soft-tissue]
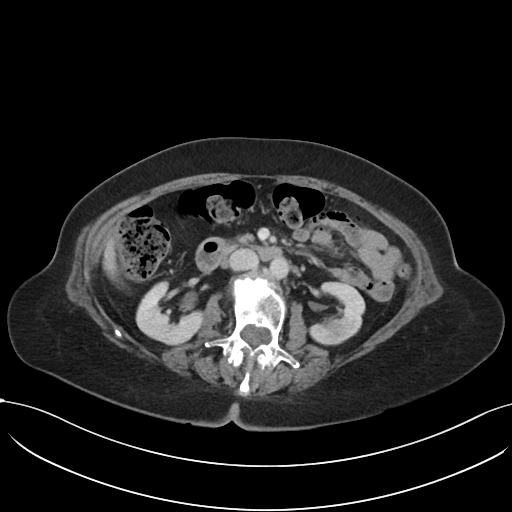
[im 63/87  soft-tissue]
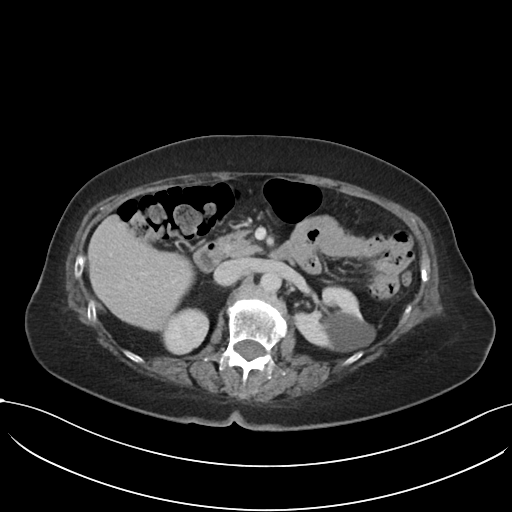
[im 67/87  soft-tissue]
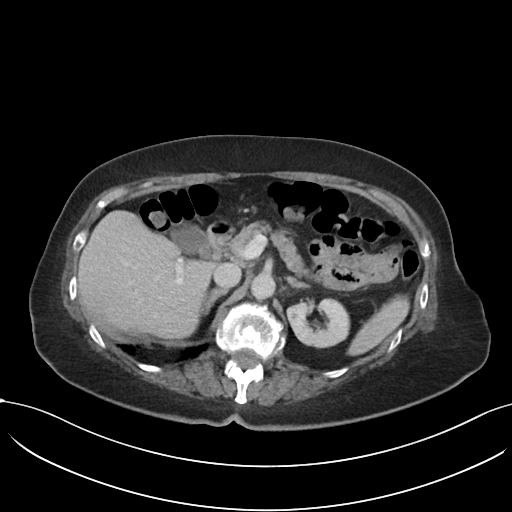
[im 77/87  soft-tissue]
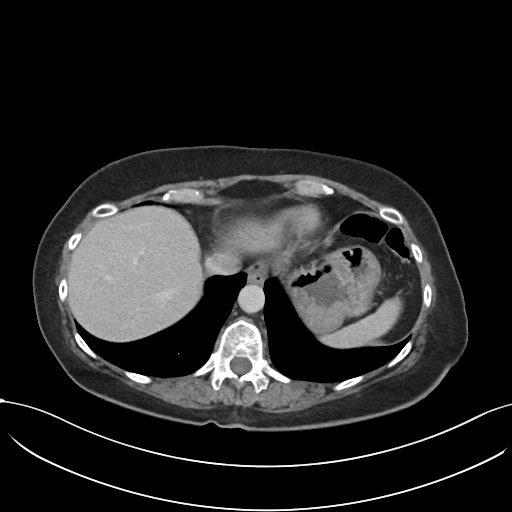
[im 82/87  soft-tissue]
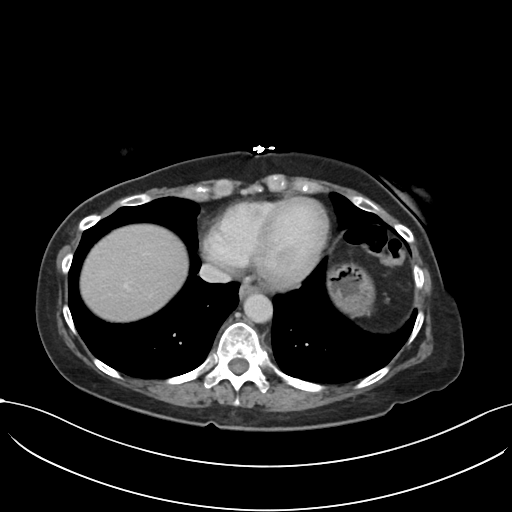

[Series 5: coronal · coronal · 0.80mm/px · 3 of 75 slices shown]
[im 25/75  soft-tissue]
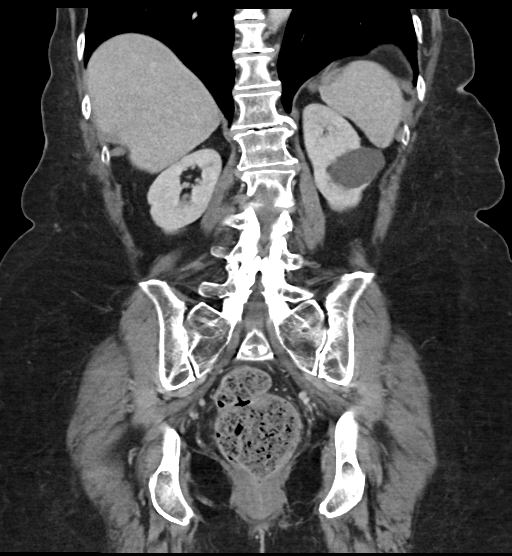
[im 33/75  soft-tissue]
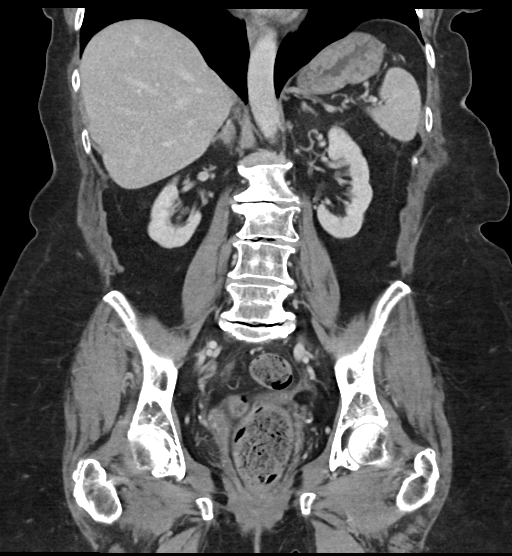
[im 42/75  soft-tissue]
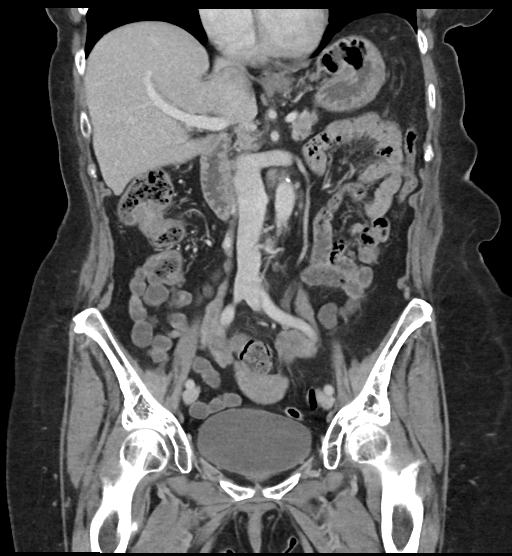

[17 of 46 positions shown; findings below may reference images not displayed]

FINDINGS: Lower chest: The visualized lung bases are clear.

No intra-abdominal free air or free fluid.

Hepatobiliary: No focal liver abnormality is seen. No gallstones,
gallbladder wall thickening, or biliary dilatation.

Pancreas: Unremarkable. No pancreatic ductal dilatation or
surrounding inflammatory changes.

Spleen: Normal in size without focal abnormality.

Adrenals/Urinary Tract: The adrenal glands are unremarkable. There
is a 2 mm nonobstructing left renal interpolar calculus. There is a
4.5 cm left renal upper pole cyst. There is no hydronephrosis on
either side. There is symmetric enhancement and excretion of
contrast by both kidneys. The visualized ureters and urinary bladder
appear unremarkable.

Stomach/Bowel: There is moderate stool throughout the colon. There
is no bowel obstruction or active inflammation. The appendix is
normal.

Vascular/Lymphatic: Mild aortoiliac atherosclerotic disease the IVC
is no pain venous gas. There is no adenopathy.

Reproductive: The uterus is grossly unremarkable. No adnexal masses.

Other: None

Musculoskeletal: Degenerative changes of the spine. No acute osseous
pathology.
IMPRESSION: 1. No acute intra-abdominal or pelvic pathology. No bowel
obstruction. Normal appendix.
2. A 2 mm nonobstructing left renal interpolar calculus. No
hydronephrosis.
3. Aortic Atherosclerosis ([id number]).

## 2023-03-27 ENCOUNTER — Other Ambulatory Visit: Payer: Self-pay | Admitting: Family Medicine

## 2023-03-27 DIAGNOSIS — Z1231 Encounter for screening mammogram for malignant neoplasm of breast: Secondary | ICD-10-CM

## 2024-03-29 ENCOUNTER — Other Ambulatory Visit: Payer: Self-pay | Admitting: Family Medicine

## 2024-03-29 DIAGNOSIS — Z1231 Encounter for screening mammogram for malignant neoplasm of breast: Secondary | ICD-10-CM

## 2024-04-26 ENCOUNTER — Ambulatory Visit
Admission: RE | Admit: 2024-04-26 | Discharge: 2024-04-26 | Disposition: A | Source: Ambulatory Visit | Attending: Family Medicine | Admitting: Family Medicine

## 2024-04-26 ENCOUNTER — Other Ambulatory Visit: Payer: Self-pay | Admitting: Family Medicine

## 2024-04-26 DIAGNOSIS — Z1231 Encounter for screening mammogram for malignant neoplasm of breast: Secondary | ICD-10-CM

## 2024-05-02 ENCOUNTER — Other Ambulatory Visit: Payer: Self-pay | Admitting: Family Medicine

## 2024-05-02 DIAGNOSIS — R928 Other abnormal and inconclusive findings on diagnostic imaging of breast: Secondary | ICD-10-CM

## 2024-05-18 ENCOUNTER — Other Ambulatory Visit: Payer: Self-pay | Admitting: Family Medicine

## 2024-05-18 ENCOUNTER — Ambulatory Visit
Admission: RE | Admit: 2024-05-18 | Discharge: 2024-05-18 | Disposition: A | Discharge status: Home / Self Care | Source: Ambulatory Visit | Attending: Family Medicine | Admitting: Family Medicine

## 2024-05-18 DIAGNOSIS — R928 Other abnormal and inconclusive findings on diagnostic imaging of breast: Secondary | ICD-10-CM

## 2024-05-18 DIAGNOSIS — N6320 Unspecified lump in the left breast, unspecified quadrant: Secondary | ICD-10-CM

## 2024-05-20 ENCOUNTER — Ambulatory Visit
Admission: RE | Admit: 2024-05-20 | Discharge: 2024-05-20 | Disposition: A | Source: Ambulatory Visit | Attending: Family Medicine | Admitting: Family Medicine

## 2024-05-20 DIAGNOSIS — N6320 Unspecified lump in the left breast, unspecified quadrant: Secondary | ICD-10-CM

## 2024-05-20 DIAGNOSIS — R928 Other abnormal and inconclusive findings on diagnostic imaging of breast: Secondary | ICD-10-CM

## 2024-05-20 HISTORY — PX: BREAST BIOPSY: SHX20

## 2024-05-23 LAB — SURGICAL PATHOLOGY

## 2024-05-26 ENCOUNTER — Other Ambulatory Visit: Payer: Self-pay

## 2024-05-26 ENCOUNTER — Ambulatory Visit: Attending: General Surgery

## 2024-05-26 ENCOUNTER — Ambulatory Visit: Payer: Self-pay | Admitting: General Surgery

## 2024-05-26 DIAGNOSIS — Z170 Estrogen receptor positive status [ER+]: Secondary | ICD-10-CM

## 2024-05-26 DIAGNOSIS — C50512 Malignant neoplasm of lower-outer quadrant of left female breast: Secondary | ICD-10-CM | POA: Insufficient documentation

## 2024-05-26 DIAGNOSIS — R293 Abnormal posture: Secondary | ICD-10-CM | POA: Diagnosis present

## 2024-05-26 NOTE — Therapy (Signed)
 " OUTPATIENT PHYSICAL THERAPY BREAST CANCER BASELINE EVALUATION   Patient Name: Savannah Jones MRN: [id number] DOB:November 19, 1945, 79 y.o., female Today's Date: 05/26/2024  END OF SESSION:  PT End of Session - 05/26/24 1624     Visit Number 1    Number of Visits 2    Date for Recertification  07/21/24    PT Start Time 1503    PT Stop Time 1550    PT Time Calculation (min) 47 min    Activity Tolerance Patient tolerated treatment well    Behavior During Therapy [facility] for tasks assessed/performed          Past Medical History:  Diagnosis Date   Arthritis    Cancer (HCC)    Hyperlipidemia    IBS (irritable bowel syndrome)    Personal history of chemotherapy 2011   Past Surgical History:  Procedure Laterality Date   BREAST BIOPSY Right 2011   x2   BREAST BIOPSY Left 05/20/2024   US  LT BREAST BX W LOC DEV 1ST LESION IMG BX SPEC US  GUIDE 05/20/2024 [facility]MO[facility]   BREAST SURGERY     MASTECTOMY Right 03/05/2010   negative sentinel bx, T1c no right breast cancer   Patient Active Problem List   Diagnosis Date Noted   Breast cancer of upper-outer quadrant of right female breast (HCC) 06/13/2013     REFERRING PROVIDER: Dr. Deward Null  REFERRING DIAG: Left Breast Cancer  THERAPY DIAG:  Malignant neoplasm of lower-outer quadrant of left female breast, unspecified estrogen receptor status (HCC)  Abnormal posture  Rationale for Evaluation and Treatment: Rehabilitation  ONSET DATE: 05/23/2024  SUBJECTIVE:                                                                                                                                                                                           SUBJECTIVE STATEMENT: Patient reports she is here today to be seen by her medical team for her newly diagnosed left breast cancer. Pt is very anxious because everything is happening so fast, and she did not initially understand why she was here. After brief conversation and explaining what we  are doing today she agreed to proceed. Her husband was also in attendance.  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 It is located in the lower-outer quadrant. . She had a prior right mastectomy with SLNB in 2011. She has no complaints of right UE lymphedema  PATIENT GOALS:   reduce lymphedema risk and learn post op HEP.   PAIN:  Are you having pain? No  PRECAUTIONS: Active CA   RED FLAGS: None  HAND DOMINANCE: left  WEIGHT BEARING RESTRICTIONS: No  FALLS:  Has patient fallen in last 6 months? No  LIVING ENVIRONMENT: Patient lives with: husband Lives in: House/apartment    OCCUPATION: None  LEISURE: walking,read  PRIOR LEVEL OF FUNCTION: Independent   OBJECTIVE: Note: Objective measures were completed at Evaluation unless otherwise noted.  COGNITION: Overall cognitive status: Within functional limits for tasks assessed    POSTURE:  Forward head and rounded shoulders posture  UPPER EXTREMITY AROM/PROM:  A/PROM RIGHT   eval   Shoulder extension 49  Shoulder flexion 124  Shoulder abduction 128  Shoulder internal rotation 70  Shoulder external rotation 94    (Blank rows = not tested)  A/PROM LEFT   eval  Shoulder extension 50  Shoulder flexion 122  Shoulder abduction 124  Shoulder internal rotation 68  Shoulder external rotation 90    (Blank rows = not tested)  CERVICAL AROM: All within fxl limits:      UPPER EXTREMITY STRENGTH: NT  LYMPHEDEMA ASSESSMENTS (in cm):   LANDMARK RIGHT   eval  10 cm proximal to olecranon process from proximal aspect of olecranon 33.8  Olecranon process 22.8  10 cm proximal to ulnar styloid process from proximal aspect of styloid process 21.4  Just distal to ulnar styloid process 15.3  Across hand at thumb web space 17.6  At base of 2nd digit 5.8  (Blank rows = not tested)  LANDMARK LEFT   eval  10 cm proximal to olecranon process from proximal  aspect of olecranon 33   Olecranon process 22.9  10 cm proximal to ulnar styloid process from proximal aspect of styloid process 21.5  Just distal to ulnar styloid process 15.0  Across hand at thumb web space 17.0  At base of 2nd digit 5.7  (Blank rows = not tested)  L-DEX LYMPHEDEMA SCREENING:  The patient was assessed using the L-Dex machine today to produce a lymphedema index baseline score. The patient will be reassessed on a regular basis (typically every 3 months) to obtain new L-Dex scores. If the score is > 6.5 points away from his/her baseline score indicating onset of subclinical lymphedema, it will be recommended to wear a compression garment for 4 weeks, 12 hours per day and then be reassessed. If the score continues to be > 6.5 points from baseline at reassessment, we will initiate lymphedema treatment. Assessing in this manner has a 95% rate of preventing clinically significant lymphedema. Did Baseline but not a true baseline: pt is s/p Right mastectomy in 2011   L-DEX FLOWSHEETS - 05/26/24 1500       L-DEX LYMPHEDEMA SCREENING   Measurement Type Unilateral    L-DEX MEASUREMENT EXTREMITY Upper Extremity    POSITION  Standing    DOMINANT SIDE Left    At Risk Side Left    BASELINE SCORE (UNILATERAL) -7.6            QUICK DASH SURVEY: 2.27  PATIENT EDUCATION:  Education details: Time spent educating patient on aspects of self-care to maximize post op recovery. Patient was educated on where and how to get a post op compression bra to use to reduce post op edema. Patient was also educated on the use of SOZO screenings and surveillance principles for early identification of lymphedema onset. She was instructed to use the post op pillow in the axilla for pressure and pain relief. Patient educated on lymphedema risk reduction and post op shoulder/posture HEP. Person educated: Patient Education method: Solicitor, Handout Education  comprehension: Patient  verbalized understanding and returned demonstration  HOME EXERCISE PROGRAM: Patient was instructed today in a home exercise program today for post op shoulder range of motion. These included active assist shoulder flexion in supine, scapular retraction , wall walking with shoulder abduction, and hands behind head external rotation(supine).  She was encouraged to do these twice a day, holding 3 seconds and repeating 5 times when permitted by her physician.   ASSESSMENT:  CLINICAL IMPRESSION: Pts multidisciplinary medical team met prior to her assessments to determine a recommended treatment plan. She is planning to have a left lumpectomy with SLNB however, no date has been established yet. She will benefit from a post op PT reassessment to determine needs and from L-Dex screens every 3 months for 2 years to detect subclinical lymphedema.  Pt will benefit from skilled therapeutic intervention to improve on the following deficits: Decreased knowledge of precautions, impaired UE functional use, pain, decreased ROM, postural dysfunction.   PT treatment/interventions: ADL/self-care home management, pt/family education, therapeutic exercise  REHAB POTENTIAL: Good  CLINICAL DECISION MAKING: Stable/uncomplicated  EVALUATION COMPLEXITY: Low   GOALS: Goals reviewed with patient? YES  LONG TERM GOALS: (STG=LTG)    Name Target Date Goal status  1 Pt will be able to verbalize understanding of pertinent lymphedema risk reduction practices relevant to her dx specifically related to skin care.  Baseline:  No knowledge 05/26/2024 Achieved at eval  2 Pt will be able to return demo and/or verbalize understanding of the post op HEP related to regaining shoulder ROM. Baseline:  No knowledge 05/26/2024 Achieved at eval  3 Pt will be able to verbalize understanding of the importance of viewing the post op After Breast CA Class video for further lymphedema risk reduction education and therapeutic exercise.   Baseline:  No knowledge 05/26/2024 Achieved at eval  4 Pt will demo she has regained full shoulder ROM and function post operatively compared to baselines.  Baseline: See objective measurements taken today. 07/21/2024 NEW    PLAN:  PT FREQUENCY/DURATION: EVAL and 1 follow up appointment.   PLAN FOR NEXT SESSION: will reassess 3-4 weeks post op to determine needs.   Patient will follow up at outpatient cancer rehab 3-4 weeks following surgery.  If the patient requires physical therapy at that time, a specific plan will be dictated and sent to the referring physician for approval. The patient was educated today on appropriate basic range of motion exercises to begin post operatively and the importance of viewing the After Breast Cancer class video following surgery.  Patient was educated today on lymphedema risk reduction practices as it pertains to recommendations that will benefit the patient immediately following surgery.  She verbalized good understanding.    Physical Therapy Information for After Breast Cancer Surgery/Treatment:  Lymphedema is a swelling condition that you may be at risk for in your arm if you have lymph nodes removed from the armpit area.  After a sentinel node biopsy, the risk is approximately 5-9% and is higher after an axillary node dissection.  There is treatment available for this condition and it is not life-threatening.  Contact your physician or physical therapist with concerns. You may begin the 4 shoulder/posture exercises (see additional sheet) when permitted by your physician (typically a week after surgery).  If you have drains, you may need to wait until those are removed before beginning range of motion exercises.  A general recommendation is to not lift your arms above shoulder height until drains are removed.  These  exercises should be done to your tolerance and gently.  This is not a no pain/no gain type of recovery so listen to your body and stretch into the  range of motion that you can tolerate, stopping if you have pain.  If you are having immediate reconstruction, ask your plastic surgeon about doing exercises as he or she may want you to wait. We encourage you to view the After Breast Cancer class video following surgery.  You will learn information related to lymphedema risk, prevention and treatment and additional exercises to regain mobility following surgery.   While undergoing any medical procedure or treatment, try to avoid blood pressure being taken or needle sticks from occurring on the arm on the side of cancer.   This recommendation begins after surgery and continues for the rest of your life.  This may help reduce your risk of getting lymphedema (swelling in your arm). An excellent resource for those seeking information on lymphedema is the National Lymphedema Network's web site. It can be accessed at www.lymphnet.org If you notice swelling in your hand, arm or breast at any time following surgery (even if it is many years from now), please contact your doctor or physical therapist to discuss this.  Lymphedema can be treated at any time but it is easier for you if it is treated early on.  If you feel like your shoulder motion is not returning to normal in a reasonable amount of time, please contact your surgeon or physical therapist.  [facility] [phone]. [address], [address], [place] [address]  [facility] CLASS After Breast Cancer Class  After Breast Cancer Class is a specially designed exercise class video to assist you in a safe recover after having breast cancer surgery.  In this video you will learn how to get back to full function whether your drains were just removed or if you had surgery a month ago. The video can be viewed on this page: [email or web address]://www.boyd-meyer.org/ or on YouTube here: https://youtu.[email or web address].  Class  Goals  Understand specific stretches to improve the flexibility of you chest and shoulder. Learn ways to safely strengthen your upper body and improve your posture. Understand the warning signs of infection and why you may be at risk for an arm infection. Learn about Lymphedema and prevention.  ** You do not need to view this video until after surgery.  Drains should be removed to participate in the recommended exercises on the video.  Patient was instructed today in a home exercise program today for post op shoulder range of motion. These included active assist shoulder flexion in sitting, scapular retraction, wall walking with shoulder abduction, and hands behind head external rotation.  She was encouraged to do these twice a day, holding 3 seconds and repeating 5 times when permitted by her physician.    Grayce JINNY Sheldon, PT 05/26/2024, 4:27 PM   "

## 2024-05-30 ENCOUNTER — Encounter: Payer: Self-pay | Admitting: *Deleted

## 2024-05-30 ENCOUNTER — Telehealth: Payer: Self-pay | Admitting: Radiation Oncology

## 2024-05-30 NOTE — Telephone Encounter (Signed)
 Spoke to pt regarding referral from Dr. Curvin. Pt advised she was confused and overwhelmed; I explained reason for referral and call to schedule. Pt and husband were confused in relation to 2/2 appt with Dr. Gudena and how this appt related to surgery. I explained difference in Med Onc and Rad Onc treatments and that we typically schedule initial pt consults prior to sx to establish care. Pt expressed how overwhelming this all was; husband wasn't sure that they wanted to schedule at this time. I advised I could close referral at this time and they can advise Dr. Curvin at any time if they wish to be seen with Dr. Shannon in Rad Onc. I advised our team is here to help if they have any additional questions and that I would notify Dr. Curvin of temporary desire to not schedule consult. They are not refusing radiation but want to wait until consult with Med Onc or possibly after surgery if it's recommended then. Referral closed at this time.

## 2024-05-30 NOTE — Progress Notes (Signed)
 Called patient and introduced myself and role of navigation. She states she feels a bit overwhelmed and this is a lot different than when she had breast cancer 13 years ago. Had a right mastectomy and was a patient of Dr. Layla.  Reviewed future appt with Dr. Odean on 2/2 and told her she will get a call from Rad Onc. Has already seen PT and Dr. Curvin. No other questions at this time. She was appreciative of the phone call.

## 2024-06-01 ENCOUNTER — Other Ambulatory Visit: Payer: Self-pay | Admitting: General Surgery

## 2024-06-01 DIAGNOSIS — C50512 Malignant neoplasm of lower-outer quadrant of left female breast: Secondary | ICD-10-CM

## 2024-06-10 ENCOUNTER — Other Ambulatory Visit: Payer: Self-pay

## 2024-06-10 ENCOUNTER — Encounter (HOSPITAL_BASED_OUTPATIENT_CLINIC_OR_DEPARTMENT_OTHER): Payer: Self-pay | Admitting: General Surgery

## 2024-06-13 ENCOUNTER — Inpatient Hospital Stay: Admitting: Hematology and Oncology

## 2024-06-14 ENCOUNTER — Other Ambulatory Visit: Payer: Self-pay | Admitting: General Surgery

## 2024-06-14 ENCOUNTER — Encounter: Payer: Self-pay | Admitting: *Deleted

## 2024-06-14 ENCOUNTER — Ambulatory Visit
Admission: RE | Admit: 2024-06-14 | Discharge: 2024-06-14 | Disposition: A | Source: Ambulatory Visit | Attending: General Surgery | Admitting: General Surgery

## 2024-06-14 DIAGNOSIS — C50512 Malignant neoplasm of lower-outer quadrant of left female breast: Secondary | ICD-10-CM

## 2024-06-15 ENCOUNTER — Encounter (HOSPITAL_BASED_OUTPATIENT_CLINIC_OR_DEPARTMENT_OTHER)
Admission: RE | Admit: 2024-06-15 | Discharge: 2024-06-15 | Disposition: A | Source: Ambulatory Visit | Attending: General Surgery

## 2024-06-15 MED ORDER — CHLORHEXIDINE GLUCONATE CLOTH 2 % EX PADS: 6.0000 | MEDICATED_PAD | Freq: Once | CUTANEOUS | Status: DC

## 2024-06-15 NOTE — Progress Notes (Signed)
" ° ° ° ° °  Enhanced Recovery after Surgery for Orthopedics Enhanced Recovery after Surgery is a protocol used to improve the stress on your body and your recovery after surgery.  Patient Instructions  The night before surgery:  No food after midnight. ONLY clear liquids after midnight  The day of surgery (if you do NOT have diabetes):  Drink ONE (1) Pre-Surgery Clear Ensure as directed.   This drink was given to you during your hospital  pre-op appointment visit. The pre-op nurse will instruct you on the time to drink the  Pre-Surgery Ensure depending on your surgery time. Finish the drink at the designated time by the pre-op nurse.  Nothing else to drink after completing the  Pre-Surgery Clear Ensure.  The day of surgery (if you have diabetes): Drink ONE (1) Gatorade 2 (G2) as directed. This drink was given to you during your hospital  pre-op appointment visit.  The pre-op nurse will instruct you on the time to drink the   Gatorade 2 (G2) depending on your surgery time. Color of the Gatorade may vary. Red is not allowed. Nothing else to drink after completing the  Gatorade 2 (G2).         If you have questions, please contact your surgeons office.  Pre-surgical soap given as well. "

## 2024-06-17 ENCOUNTER — Encounter (HOSPITAL_BASED_OUTPATIENT_CLINIC_OR_DEPARTMENT_OTHER): Payer: Self-pay | Admitting: General Surgery

## 2024-06-17 ENCOUNTER — Ambulatory Visit (HOSPITAL_BASED_OUTPATIENT_CLINIC_OR_DEPARTMENT_OTHER)
Admission: RE | Admit: 2024-06-17 | Discharge: 2024-06-17 | Disposition: A | Source: Home / Self Care | Attending: General Surgery | Admitting: General Surgery

## 2024-06-17 ENCOUNTER — Encounter (HOSPITAL_COMMUNITY): Admission: RE | Admit: 2024-06-17

## 2024-06-17 ENCOUNTER — Ambulatory Visit (HOSPITAL_BASED_OUTPATIENT_CLINIC_OR_DEPARTMENT_OTHER): Admitting: Anesthesiology

## 2024-06-17 ENCOUNTER — Other Ambulatory Visit: Payer: Self-pay

## 2024-06-17 ENCOUNTER — Encounter (HOSPITAL_COMMUNITY): Payer: Self-pay

## 2024-06-17 ENCOUNTER — Inpatient Hospital Stay: Admission: RE | Admit: 2024-06-17

## 2024-06-17 ENCOUNTER — Encounter (HOSPITAL_BASED_OUTPATIENT_CLINIC_OR_DEPARTMENT_OTHER)
Admission: RE | Disposition: A | Discharge status: Home / Self Care | Payer: Self-pay | Source: Home / Self Care | Attending: General Surgery

## 2024-06-17 DIAGNOSIS — Z170 Estrogen receptor positive status [ER+]: Secondary | ICD-10-CM

## 2024-06-17 DIAGNOSIS — I10 Essential (primary) hypertension: Secondary | ICD-10-CM

## 2024-06-17 HISTORY — DX: Essential (primary) hypertension: I10

## 2024-06-17 MED ORDER — BUPIVACAINE-EPINEPHRINE (PF) 0.25% -1:200000 IJ SOLN
INTRAMUSCULAR | Status: AC
  Filled 2024-06-17: qty 30

## 2024-06-17 MED ORDER — LIDOCAINE 2% (20 MG/ML) 5 ML SYRINGE
INTRAMUSCULAR | Status: DC | PRN
  Administered 2024-06-17: 60 mg via INTRAVENOUS

## 2024-06-17 MED ORDER — CEFAZOLIN SODIUM-DEXTROSE 2-4 GM/100ML-% IV SOLN
2.0000 g | INTRAVENOUS | Status: AC
  Administered 2024-06-17: 2 g via INTRAVENOUS

## 2024-06-17 MED ORDER — GABAPENTIN 100 MG PO CAPS
ORAL_CAPSULE | ORAL | Status: AC
  Filled 2024-06-17: qty 1

## 2024-06-17 MED ORDER — ONDANSETRON HCL 4 MG/2ML IJ SOLN
INTRAMUSCULAR | Status: AC
  Filled 2024-06-17: qty 2

## 2024-06-17 MED ORDER — OXYCODONE HCL 5 MG PO TABS: 5.0000 mg | ORAL_TABLET | Freq: Four times a day (QID) | ORAL | 0 refills | Status: AC | PRN

## 2024-06-17 MED ORDER — EPHEDRINE SULFATE-NACL 50-0.9 MG/10ML-% IV SOSY
PREFILLED_SYRINGE | INTRAVENOUS | Status: DC | PRN
  Administered 2024-06-17: 10 mg via INTRAVENOUS
  Administered 2024-06-17: 5 mg via INTRAVENOUS

## 2024-06-17 MED ORDER — ONDANSETRON HCL 4 MG/2ML IJ SOLN
INTRAMUSCULAR | Status: DC | PRN
  Administered 2024-06-17: 4 mg via INTRAVENOUS

## 2024-06-17 MED ORDER — DEXAMETHASONE SOD PHOSPHATE PF 10 MG/ML IJ SOLN
INTRAMUSCULAR | Status: AC
  Filled 2024-06-17: qty 1

## 2024-06-17 MED ORDER — PROPOFOL 10 MG/ML IV BOLUS
INTRAVENOUS | Status: AC
  Filled 2024-06-17: qty 20

## 2024-06-17 MED ORDER — LIDOCAINE-EPINEPHRINE (PF) 1 %-1:200000 IJ SOLN
INTRAMUSCULAR | Status: AC
  Filled 2024-06-17: qty 30

## 2024-06-17 MED ORDER — DEXAMETHASONE SOD PHOSPHATE PF 10 MG/ML IJ SOLN
INTRAMUSCULAR | Status: DC | PRN
  Administered 2024-06-17: 10 mg via INTRAVENOUS

## 2024-06-17 MED ORDER — BUPIVACAINE-EPINEPHRINE 0.25% -1:200000 IJ SOLN
INTRAMUSCULAR | Status: DC | PRN
  Administered 2024-06-17: 10 mL

## 2024-06-17 MED ORDER — PHENYLEPHRINE 80 MCG/ML (10ML) SYRINGE FOR IV PUSH (FOR BLOOD PRESSURE SUPPORT)
PREFILLED_SYRINGE | INTRAVENOUS | Status: DC | PRN
  Administered 2024-06-17: 160 ug via INTRAVENOUS
  Administered 2024-06-17: 80 ug via INTRAVENOUS
  Administered 2024-06-17: 160 ug via INTRAVENOUS
  Administered 2024-06-17 (×2): 80 ug via INTRAVENOUS

## 2024-06-17 MED ORDER — MIDAZOLAM HCL 2 MG/2ML IJ SOLN
INTRAMUSCULAR | Status: AC
  Filled 2024-06-17: qty 2

## 2024-06-17 MED ORDER — CEFAZOLIN SODIUM-DEXTROSE 2-4 GM/100ML-% IV SOLN
INTRAVENOUS | Status: AC
  Filled 2024-06-17: qty 100

## 2024-06-17 MED ORDER — GABAPENTIN 100 MG PO CAPS
100.0000 mg | ORAL_CAPSULE | ORAL | Status: AC
  Administered 2024-06-17: 100 mg via ORAL

## 2024-06-17 MED ORDER — ACETAMINOPHEN 500 MG PO TABS
1000.0000 mg | ORAL_TABLET | Freq: Once | ORAL | Status: AC
  Administered 2024-06-17: 1000 mg via ORAL

## 2024-06-17 MED ORDER — LACTATED RINGERS IV SOLN: INTRAVENOUS | Status: DC

## 2024-06-17 MED ORDER — 0.9 % SODIUM CHLORIDE (POUR BTL) OPTIME
TOPICAL | Status: DC | PRN
  Administered 2024-06-17: 1000 mL

## 2024-06-17 MED ORDER — ONDANSETRON HCL 4 MG/2ML IJ SOLN: 4.0000 mg | Freq: Once | INTRAMUSCULAR | Status: DC | PRN

## 2024-06-17 MED ORDER — LIDOCAINE 2% (20 MG/ML) 5 ML SYRINGE
INTRAMUSCULAR | Status: AC
  Filled 2024-06-17: qty 5

## 2024-06-17 MED ORDER — ACETAMINOPHEN 500 MG PO TABS
ORAL_TABLET | ORAL | Status: AC
  Filled 2024-06-17: qty 2

## 2024-06-17 MED ORDER — FENTANYL CITRATE (PF) 100 MCG/2ML IJ SOLN
INTRAMUSCULAR | Status: DC | PRN
  Administered 2024-06-17 (×2): 50 ug via INTRAVENOUS

## 2024-06-17 MED ORDER — FENTANYL CITRATE (PF) 100 MCG/2ML IJ SOLN
INTRAMUSCULAR | Status: AC
  Filled 2024-06-17: qty 2

## 2024-06-17 MED ORDER — PROPOFOL 10 MG/ML IV BOLUS
INTRAVENOUS | Status: DC | PRN
  Administered 2024-06-17: 130 mg via INTRAVENOUS

## 2024-06-17 MED ORDER — FENTANYL CITRATE (PF) 100 MCG/2ML IJ SOLN
50.0000 ug | Freq: Once | INTRAMUSCULAR | Status: AC
  Administered 2024-06-17: 50 ug via INTRAVENOUS

## 2024-06-17 MED ORDER — FENTANYL CITRATE (PF) 100 MCG/2ML IJ SOLN: 25.0000 ug | INTRAMUSCULAR | Status: DC | PRN

## 2024-06-17 MED ORDER — TECHNETIUM TC 99M TILMANOCEPT KIT
1.0000 | PACK | Freq: Once | INTRAVENOUS | Status: AC
  Administered 2024-06-17: 1.1 via INTRADERMAL

## 2024-06-17 MED ORDER — ACETAMINOPHEN 500 MG PO TABS: 1000.0000 mg | ORAL_TABLET | ORAL | Status: AC

## 2024-06-17 MED ORDER — OXYCODONE HCL 5 MG/5ML PO SOLN: 5.0000 mg | Freq: Once | ORAL | Status: DC | PRN

## 2024-06-17 MED ORDER — OXYCODONE HCL 5 MG PO TABS: 5.0000 mg | ORAL_TABLET | Freq: Once | ORAL | Status: DC | PRN

## 2024-06-17 MED ORDER — BUPIVACAINE-EPINEPHRINE (PF) 0.5% -1:200000 IJ SOLN
INTRAMUSCULAR | Status: DC | PRN
  Administered 2024-06-17: 30 mL

## 2024-06-17 NOTE — Anesthesia Preprocedure Evaluation (Addendum)
"                                    Anesthesia Evaluation  Patient identified by MRN, date of birth, ID band Patient awake    Reviewed: Allergy & Precautions, NPO status , Patient's Chart, lab work & pertinent test results  History of Anesthesia Complications Negative for: history of anesthetic complications  Airway Mallampati: II  TM Distance: >3 FB Neck ROM: Full    Dental  (+) Dental Advisory Given, Teeth Intact   Pulmonary former smoker   Pulmonary exam normal        Cardiovascular hypertension, Pt. on medications Normal cardiovascular exam     Neuro/Psych negative neurological ROS  negative psych ROS   GI/Hepatic negative GI ROS, Neg liver ROS,,,  Endo/Other  negative endocrine ROS    Renal/GU negative Renal ROS     Musculoskeletal  (+) Arthritis ,    Abdominal   Peds  Hematology negative hematology ROS (+)   Anesthesia Other Findings   Reproductive/Obstetrics  Breast cancer                               Anesthesia Physical Anesthesia Plan  ASA: 2  Anesthesia Plan: General   Post-op Pain Management: Tylenol  PO (pre-op)* and Regional block*   Induction: Intravenous  PONV Risk Score and Plan: 3 and Treatment may vary due to age or medical condition, Ondansetron  and TIVA  Airway Management Planned: LMA  Additional Equipment: None  Intra-op Plan:   Post-operative Plan: Extubation in OR  Informed Consent: I have reviewed the patients History and Physical, chart, labs and discussed the procedure including the risks, benefits and alternatives for the proposed anesthesia with the patient or authorized representative who has indicated his/her understanding and acceptance.     Dental advisory given  Plan Discussed with: CRNA and Anesthesiologist  Anesthesia Plan Comments:          Anesthesia Quick Evaluation  "

## 2024-06-17 NOTE — Transfer of Care (Signed)
 Immediate Anesthesia Transfer of Care Note  Patient: Savannah Jones  Procedure(s) Performed: BREAST LUMPECTOMY WITH RADIOACTIVE SEED AND SENTINEL LYMPH NODE BIOPSY (Left: Breast)  Patient Location: PACU  Anesthesia Type:General  Level of Consciousness: awake, alert , and oriented  Airway & Oxygen Therapy: Patient Spontanous Breathing and Patient connected to face mask oxygen  Post-op Assessment: Report given to RN and Post -op Vital signs reviewed and stable  Post vital signs: Reviewed and stable  Last Vitals:  Vitals Value Taken Time  BP 139/75 06/17/24 11:43  Temp    Pulse 90 06/17/24 11:44  Resp 19 06/17/24 11:43  SpO2 96 % 06/17/24 11:44  Vitals shown include unfiled device data.  Last Pain:  Vitals:   06/17/24 0851  TempSrc: Temporal  PainSc: 0-No pain      Patients Stated Pain Goal: 3 (06/17/24 0851)  Complications: No notable events documented.

## 2024-06-17 NOTE — Discharge Instructions (Signed)
May take Tylenol after 3pm, if needed.    Post Anesthesia Home Care Instructions  Activity: Get plenty of rest for the remainder of the day. A responsible individual must stay with you for 24 hours following the procedure.  For the next 24 hours, DO NOT: -Drive a car -Operate machinery -Drink alcoholic beverages -Take any medication unless instructed by your physician -Make any legal decisions or sign important papers.  Meals: Start with liquid foods such as gelatin or soup. Progress to regular foods as tolerated. Avoid greasy, spicy, heavy foods. If nausea and/or vomiting occur, drink only clear liquids until the nausea and/or vomiting subsides. Call your physician if vomiting continues.  Special Instructions/Symptoms: Your throat may feel dry or sore from the anesthesia or the breathing tube placed in your throat during surgery. If this causes discomfort, gargle with warm salt water. The discomfort should disappear within 24 hours.  If you had a scopolamine patch placed behind your ear for the management of post- operative nausea and/or vomiting:  1. The medication in the patch is effective for 72 hours, after which it should be removed.  Wrap patch in a tissue and discard in the trash. Wash hands thoroughly with soap and water. 2. You may remove the patch earlier than 72 hours if you experience unpleasant side effects which may include dry mouth, dizziness or visual disturbances. 3. Avoid touching the patch. Wash your hands with soap and water after contact with the patch.    

## 2024-06-17 NOTE — Progress Notes (Signed)
Assisted Dr. Fransisco Beau with left, pectoralis, ultrasound guided block. Side rails up, monitors on throughout procedure. See vital signs in flow sheet. Tolerated Procedure well.

## 2024-06-17 NOTE — Anesthesia Postprocedure Evaluation (Signed)
"   Anesthesia Post Note  Patient: Savannah Jones  Procedure(s) Performed: BREAST LUMPECTOMY WITH RADIOACTIVE SEED AND SENTINEL LYMPH NODE BIOPSY (Left: Breast)     Patient location during evaluation: PACU Anesthesia Type: General Level of consciousness: awake and alert Pain management: pain level controlled Vital Signs Assessment: post-procedure vital signs reviewed and stable Respiratory status: spontaneous breathing, nonlabored ventilation and respiratory function stable Cardiovascular status: stable and blood pressure returned to baseline Anesthetic complications: no   No notable events documented.  Last Vitals:  Vitals:   06/17/24 1215 06/17/24 1233  BP: (!) 146/68 (!) 167/73  Pulse: 84 83  Resp: 13 16  Temp:  (!) 36.3 C  SpO2: 92% 93%    Last Pain:  Vitals:   06/17/24 1233  TempSrc: Temporal  PainSc: 0-No pain                 Debby FORBES Like      "

## 2024-06-17 NOTE — Op Note (Signed)
 OR Date: 06/17/2024 OR Patient Start Time: 10:27 AM  11:34 AM  PATIENT:  Savannah Jones  79 y.o. female  PRE-OPERATIVE DIAGNOSIS:  LEFT BREAST CANCER  POST-OPERATIVE DIAGNOSIS:  LEFT BREAST CANCER  PROCEDURE:  Procedures with comments: LEFT BREAST LUMPECTOMY WITH RADIOACTIVE SEED LOCALIZATION AND DEEP LEFT AXILLARY SENTINEL LYMPH NODE BIOPSY (Left) - GEN w/PEC BLOCK  SURGEON:  Surgeons and Role:    * Curvin Deward MOULD, MD - Primary  PHYSICIAN ASSISTANT:   ASSISTANTS: Dr. Francia   ANESTHESIA:   local and general  EBL:  minimal   BLOOD ADMINISTERED:none  DRAINS: none   LOCAL MEDICATIONS USED:  MARCAINE      SPECIMEN:  Source of Specimen:  left breast tissue with additional lateral, inferior, and deep margins and sentinel node x 2  DISPOSITION OF SPECIMEN:  PATHOLOGY  COUNTS:  YES  TOURNIQUET:  * No tourniquets in log *  DICTATION: .Dragon Dictation  After informed consent was obtained the patient was brought to the operating room and placed in the supine position on the operating room table.  After adequate induction of general anesthesia the patient's left chest, breast, and axillary area were prepped with ChloraPrep, allowed to dry, and draped in usual sterile manner.  An appropriate timeout was performed.  Previously an I-125 seed was placed in the lower outer left breast to mark an area of invasive breast cancer.  Also earlier in the day the patient underwent injection of 1 mCi of technetium sulfur colloid in the subareolar position on the left.  The neoprobe was initially set to technetium and a good signal was identified in the left axilla.  This area was infiltrated with quarter percent Marcaine .  A small transversely oriented incision was made with a 15 blade knife overlying the signal.  The incision was carried through the skin and subcutaneous tissue sharply with the electrocautery until the deep left axillary space was entered.  The neoprobe was used to direct blunt  hemostat dissection.  I was able to identify 2 lymph nodes with signal.  Each of these was excised sharply with the electrocautery and the surrounding small vessels and lymphatics were controlled with clips.  Ex vivo counts on these nodes ranged from 1000-2000.  No other hot or palpable nodes were identified in the left axilla.  Hemostasis was achieved using the Bovie electrocautery.  The deep layer of the incision was closed with interrupted 3-0 Vicryl stitches.  The skin was closed with a running 4-0 Monocryl subcuticular stitch.  Attention was then turned to the left breast.  The neoprobe was set to I-125 and the area of radioactivity was readily identified in the lower outer quadrant.  The area around this was infiltrated with quarter percent Marcaine .  I elected to make an elliptical vertically oriented incision overlying the area of radioactivity with a 15 blade knife.  The incision was carried through the skin and subcutaneous tissue sharply with the electrocautery.  Dissection was then carried widely around the radioactive seed while checking the area of radioactivity frequently.  Once the tissue was removed it was oriented with the appropriate paint colors.  A specimen radiograph was obtained that showed the clip and seed to be within the specimen.  I did elect to take an additional lateral, inferior, and deep margin based off the imaging.  These were also marked appropriately and all of the tissue was sent to pathology for further evaluation.  Hemostasis was achieved using the Bovie electrocautery.  The cavity was marked  with clips.  The deep layer of the incision was then closed with layers of interrupted 3-0 Vicryl stitches.  The skin was closed with a running 4-0 Monocryl subcuticular stitch.  Dermabond dressings were applied.  The patient tolerated the procedure well.  At the end of the case all needle sponge and instrument counts were correct.  The patient was then awakened and taken to recovery in  stable condition.  PLAN OF CARE: Discharge to home after PACU  PATIENT DISPOSITION:  PACU - hemodynamically stable.   Delay start of Pharmacological VTE agent (>24hrs) due to surgical blood loss or risk of bleeding: not applicable

## 2024-06-17 NOTE — H&P (Signed)
 " REFERRING PHYSICIAN: Loreli Kins, MD PROVIDER: DEWARD GARNETTE NULL, MD MRN: [id number] DOB: December 14, 1945 Subjective   Chief Complaint: Breast Cancer  History of Present Illness: Savannah Jones is a 79 y.o. female who is seen today as an office consultation for evaluation of Breast Cancer  We are asked to see the patient in consultation by Dr. Suzen Loreli to evaluate her for a new left breast cancer. The patient is a 79 year old white female who recently went for a routine screening mammogram. At that time she was found to have a 1.4 cm mass in the lower outer quadrant of the left breast. The axilla looked normal. 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 She is also a 13-year status post right mastectomy for breast cancer.  Review of Systems: A complete review of systems was obtained from the patient. I have reviewed this information and discussed as appropriate with the patient. See HPI as well for other ROS.  ROS   Medical History: Past Medical History:  Diagnosis Date  Arthritis  History of cancer   Patient Active Problem List  Diagnosis  Malignant neoplasm of lower-outer quadrant of left breast of female, estrogen receptor positive (CMS/HHS-HCC)   Past Surgical History:  Procedure Laterality Date  MASTECTOMY    Allergies  Allergen Reactions  Sulfa (Sulfonamide Antibiotics) Hives and Other (See Comments)   Current Outpatient Medications on File Prior to Visit  Medication Sig Dispense Refill  ramipriL (ALTACE) 10 MG capsule   No current facility-administered medications on file prior to visit.   Family History  Problem Relation Age of Onset  Depression Mother  High blood pressure (Hypertension) Father    Social History   Tobacco Use  Smoking Status Former  Types: Cigarettes  Smokeless Tobacco Never    Social History   Socioeconomic History  Marital status:  Married  Tobacco Use  Smoking status: Former  Types: Cigarettes  Smokeless tobacco: Never   Social Drivers of Health   Housing Stability: Unknown (05/26/2024)  Housing Stability Vital Sign  Homeless in the Last Year: No   Objective:   Vitals:  BP: 136/86  Weight: 70 kg (154 lb 6.4 oz)  Height: 160 cm (5' 3)  PainSc: 0-No pain   Body mass index is 27.35 kg/m.  Physical Exam Vitals reviewed.  Constitutional:  General: She is not in acute distress. Appearance: Normal appearance.  HENT:  Head: Normocephalic and atraumatic.  Right Ear: External ear normal.  Left Ear: External ear normal.  Nose: Nose normal.  Mouth/Throat:  Mouth: Mucous membranes are moist.  Pharynx: Oropharynx is clear.  Eyes:  General: No scleral icterus. Extraocular Movements: Extraocular movements intact.  Conjunctiva/sclera: Conjunctivae normal.  Pupils: Pupils are equal, round, and reactive to light.  Cardiovascular:  Rate and Rhythm: Normal rate and regular rhythm.  Pulses: Normal pulses.  Heart sounds: Normal heart sounds.  Pulmonary:  Effort: Pulmonary effort is normal. No respiratory distress.  Breath sounds: Normal breath sounds.  Abdominal:  General: Bowel sounds are normal.  Palpations: Abdomen is soft.  Tenderness: There is no abdominal tenderness.  Musculoskeletal:  General: No swelling, tenderness or deformity. Normal range of motion.  Cervical back: Normal range of motion and neck supple.  Skin: General: Skin is warm and dry.  Coloration: Skin is not jaundiced.  Neurological:  General: No focal deficit present.  Mental Status: She is alert and oriented to person,  place, and time.  Psychiatric:  Mood and Affect: Mood normal.  Behavior: Behavior normal.     Breast: There is no palpable mass in the left breast. There is no palpable mass of the right chest wall. There is no palpable axillary, supraclavicular, or cervical lymphadenopathy.  Labs, Imaging and Diagnostic  Testing:  Assessment and Plan:   Diagnoses and all orders for this visit:  Malignant neoplasm of lower-outer quadrant of left breast of female, estrogen receptor positive (CMS/HHS-HCC) - [facility] Case Posting Request; Future - Ambulatory Referral to Oncology-Medical - Ambulatory Referral to Radiation Oncology - Ambulatory Referral to Physical Therapy   The patient has a new 1.4 cm cancer in the lower outer quadrant of the left breast with clinically negative nodes and favorable markers. I have discussed with her in detail the different options for treatment and at this point she favors breast conservation which I feel is very reasonable. She will be a good candidate for sentinel node biopsy as well. I have discussed with her in detail the risks and benefits of the operation as well as some of the technical aspects including use of a radioactive seed for localization and she understands and wishes to proceed. We will move forward with surgical scheduling. I will refer her to medical and radiation oncology as well to discuss adjuvant therapy  "

## 2024-06-17 NOTE — Interval H&P Note (Signed)
 History and Physical Interval Note:  06/17/2024 9:36 AM  Savannah Jones  has presented today for surgery, with the diagnosis of LEFT BREAST CANCER.  The various methods of treatment have been discussed with the patient and family. After consideration of risks, benefits and other options for treatment, the patient has consented to  Procedures with comments: BREAST LUMPECTOMY WITH RADIOACTIVE SEED AND SENTINEL LYMPH NODE BIOPSY (Left) - GEN w/PEC BLOCK as a surgical intervention.  The patient's history has been reviewed, patient examined, no change in status, stable for surgery.  I have reviewed the patient's chart and labs.  Questions were answered to the patient's satisfaction.     [name]

## 2024-06-17 NOTE — Anesthesia Procedure Notes (Signed)
 Procedure Name: LMA Insertion Date/Time: 06/17/2024 10:29 AM  Performed by: Issiac Jamar D, CRNAPre-anesthesia Checklist: Patient identified, Emergency Drugs available, Suction available and Patient being monitored Patient Re-evaluated:Patient Re-evaluated prior to induction Oxygen Delivery Method: Circle system utilized Preoxygenation: Pre-oxygenation with 100% oxygen Induction Type: IV induction Ventilation: Mask ventilation without difficulty LMA: LMA inserted LMA Size: 3.0 Tube type: Oral Number of attempts: 1 Placement Confirmation: positive ETCO2 and breath sounds checked- equal and bilateral Tube secured with: Tape Dental Injury: Teeth and Oropharynx as per pre-operative assessment

## 2024-06-17 NOTE — Anesthesia Procedure Notes (Signed)
 Anesthesia Regional Block: Pectoralis block   Pre-Anesthetic Checklist: , timeout performed,  Correct Patient, Correct Site, Correct Laterality,  Correct Procedure, Correct Position, site marked,  Risks and benefits discussed,  Surgical consent,  Pre-op evaluation,  At surgeon's request and post-op pain management  Laterality: Left  Prep: chloraprep       Needles:  Injection technique: Single-shot  Needle Type: Echogenic Needle     Needle Length: 10cm  Needle Gauge: 21     Additional Needles:   Narrative:  Start time: 06/17/2024 9:25 AM End time: 06/17/2024 9:28 AM Injection made incrementally with aspirations every 5 mL.  Performed by: Personally  Anesthesiologist: Lucious Debby BRAVO, MD  Additional Notes: No pain on injection. No increased resistance to injection. Injection made in 5cc increments. Good needle visualization. Patient tolerated the procedure well.

## 2024-06-30 ENCOUNTER — Inpatient Hospital Stay: Admitting: Hematology and Oncology
